# Patient Record
Sex: Female | Born: 1940 | Race: White | Hispanic: No | Smoking: Never smoker
Health system: Southern US, Community
[De-identification: ages and names within clinical notes are randomized; demographics above are authoritative.]

## PROBLEM LIST (undated history)

## (undated) DIAGNOSIS — I2510 Atherosclerotic heart disease of native coronary artery without angina pectoris: Secondary | ICD-10-CM

## (undated) DIAGNOSIS — I480 Paroxysmal atrial fibrillation: Secondary | ICD-10-CM

## (undated) DIAGNOSIS — G2581 Restless legs syndrome: Secondary | ICD-10-CM

## (undated) DIAGNOSIS — E119 Type 2 diabetes mellitus without complications: Secondary | ICD-10-CM

## (undated) HISTORY — PX: ABDOMINAL HYSTERECTOMY: SHX81

## (undated) HISTORY — PX: CHOLECYSTECTOMY: SHX55

## (undated) HISTORY — PX: APPENDECTOMY: SHX54

---

## 2000-05-21 ENCOUNTER — Other Ambulatory Visit: Admission: RE | Admit: 2000-05-21 | Discharge: 2000-05-21 | Payer: Self-pay | Admitting: Gynecology

## 2002-05-23 ENCOUNTER — Other Ambulatory Visit: Admission: RE | Admit: 2002-05-23 | Discharge: 2002-05-23 | Payer: Self-pay | Admitting: Gynecology

## 2003-08-08 ENCOUNTER — Other Ambulatory Visit: Admission: RE | Admit: 2003-08-08 | Discharge: 2003-08-08 | Payer: Self-pay | Admitting: Gynecology

## 2004-08-27 ENCOUNTER — Other Ambulatory Visit: Admission: RE | Admit: 2004-08-27 | Discharge: 2004-08-27 | Payer: Self-pay | Admitting: Gynecology

## 2005-09-17 ENCOUNTER — Other Ambulatory Visit: Admission: RE | Admit: 2005-09-17 | Discharge: 2005-09-17 | Payer: Self-pay | Admitting: Gynecology

## 2012-12-08 ENCOUNTER — Telehealth: Payer: Self-pay | Admitting: *Deleted

## 2012-12-13 NOTE — Telephone Encounter (Signed)
Opened by mistake.

## 2016-01-19 DIAGNOSIS — R079 Chest pain, unspecified: Secondary | ICD-10-CM | POA: Diagnosis not present

## 2016-01-19 DIAGNOSIS — I10 Essential (primary) hypertension: Secondary | ICD-10-CM | POA: Diagnosis not present

## 2016-01-19 DIAGNOSIS — E785 Hyperlipidemia, unspecified: Secondary | ICD-10-CM | POA: Diagnosis not present

## 2016-01-20 DIAGNOSIS — I10 Essential (primary) hypertension: Secondary | ICD-10-CM | POA: Diagnosis not present

## 2016-01-20 DIAGNOSIS — E785 Hyperlipidemia, unspecified: Secondary | ICD-10-CM | POA: Diagnosis not present

## 2016-01-20 DIAGNOSIS — R079 Chest pain, unspecified: Secondary | ICD-10-CM | POA: Diagnosis not present

## 2017-04-13 DIAGNOSIS — I4891 Unspecified atrial fibrillation: Secondary | ICD-10-CM

## 2017-04-13 DIAGNOSIS — R079 Chest pain, unspecified: Secondary | ICD-10-CM

## 2017-04-13 DIAGNOSIS — R002 Palpitations: Secondary | ICD-10-CM | POA: Diagnosis not present

## 2017-04-14 DIAGNOSIS — I4891 Unspecified atrial fibrillation: Secondary | ICD-10-CM | POA: Diagnosis not present

## 2017-04-14 DIAGNOSIS — R079 Chest pain, unspecified: Secondary | ICD-10-CM | POA: Diagnosis not present

## 2017-04-23 ENCOUNTER — Ambulatory Visit: Payer: Medicare Other | Admitting: Cardiology

## 2019-02-20 ENCOUNTER — Other Ambulatory Visit: Payer: Self-pay | Admitting: Family Medicine

## 2019-12-12 ENCOUNTER — Other Ambulatory Visit: Payer: Self-pay | Admitting: Family Medicine

## 2020-06-05 ENCOUNTER — Inpatient Hospital Stay (HOSPITAL_COMMUNITY)
Admission: EM | Admit: 2020-06-05 | Discharge: 2020-06-10 | DRG: 177 | Disposition: A | Discharge status: Home Health | Payer: Medicare Other | Attending: Internal Medicine | Admitting: Internal Medicine

## 2020-06-05 ENCOUNTER — Emergency Department (HOSPITAL_COMMUNITY): Payer: Medicare Other

## 2020-06-05 ENCOUNTER — Other Ambulatory Visit: Payer: Self-pay

## 2020-06-05 ENCOUNTER — Encounter (HOSPITAL_COMMUNITY): Payer: Self-pay | Admitting: *Deleted

## 2020-06-05 DIAGNOSIS — E1165 Type 2 diabetes mellitus with hyperglycemia: Secondary | ICD-10-CM | POA: Diagnosis present

## 2020-06-05 DIAGNOSIS — Z888 Allergy status to other drugs, medicaments and biological substances status: Secondary | ICD-10-CM

## 2020-06-05 DIAGNOSIS — G9341 Metabolic encephalopathy: Secondary | ICD-10-CM | POA: Diagnosis not present

## 2020-06-05 DIAGNOSIS — Z9049 Acquired absence of other specified parts of digestive tract: Secondary | ICD-10-CM

## 2020-06-05 DIAGNOSIS — Z91040 Latex allergy status: Secondary | ICD-10-CM | POA: Diagnosis not present

## 2020-06-05 DIAGNOSIS — Z7982 Long term (current) use of aspirin: Secondary | ICD-10-CM

## 2020-06-05 DIAGNOSIS — R0602 Shortness of breath: Secondary | ICD-10-CM

## 2020-06-05 DIAGNOSIS — U071 COVID-19: Secondary | ICD-10-CM | POA: Diagnosis present

## 2020-06-05 DIAGNOSIS — R0902 Hypoxemia: Secondary | ICD-10-CM | POA: Diagnosis present

## 2020-06-05 DIAGNOSIS — Z6836 Body mass index (BMI) 36.0-36.9, adult: Secondary | ICD-10-CM

## 2020-06-05 DIAGNOSIS — E119 Type 2 diabetes mellitus without complications: Secondary | ICD-10-CM | POA: Diagnosis not present

## 2020-06-05 DIAGNOSIS — Z885 Allergy status to narcotic agent status: Secondary | ICD-10-CM

## 2020-06-05 DIAGNOSIS — R0603 Acute respiratory distress: Secondary | ICD-10-CM | POA: Diagnosis present

## 2020-06-05 DIAGNOSIS — I2510 Atherosclerotic heart disease of native coronary artery without angina pectoris: Secondary | ICD-10-CM | POA: Diagnosis present

## 2020-06-05 DIAGNOSIS — Z955 Presence of coronary angioplasty implant and graft: Secondary | ICD-10-CM

## 2020-06-05 DIAGNOSIS — Z90710 Acquired absence of both cervix and uterus: Secondary | ICD-10-CM | POA: Diagnosis not present

## 2020-06-05 DIAGNOSIS — Z905 Acquired absence of kidney: Secondary | ICD-10-CM

## 2020-06-05 DIAGNOSIS — R531 Weakness: Secondary | ICD-10-CM | POA: Diagnosis not present

## 2020-06-05 DIAGNOSIS — J1282 Pneumonia due to coronavirus disease 2019: Secondary | ICD-10-CM | POA: Diagnosis present

## 2020-06-05 DIAGNOSIS — R627 Adult failure to thrive: Secondary | ICD-10-CM | POA: Diagnosis present

## 2020-06-05 DIAGNOSIS — R5383 Other fatigue: Secondary | ICD-10-CM

## 2020-06-05 DIAGNOSIS — I480 Paroxysmal atrial fibrillation: Secondary | ICD-10-CM | POA: Diagnosis present

## 2020-06-05 DIAGNOSIS — Z7901 Long term (current) use of anticoagulants: Secondary | ICD-10-CM

## 2020-06-05 DIAGNOSIS — Z79899 Other long term (current) drug therapy: Secondary | ICD-10-CM

## 2020-06-05 HISTORY — DX: Paroxysmal atrial fibrillation: I48.0

## 2020-06-05 HISTORY — DX: Atherosclerotic heart disease of native coronary artery without angina pectoris: I25.10

## 2020-06-05 HISTORY — DX: Restless legs syndrome: G25.81

## 2020-06-05 HISTORY — DX: Type 2 diabetes mellitus without complications: E11.9

## 2020-06-05 LAB — CBC
HCT: 48 % — ABNORMAL HIGH (ref 36.0–46.0)
Hemoglobin: 16.4 g/dL — ABNORMAL HIGH (ref 12.0–15.0)
MCH: 28.8 pg (ref 26.0–34.0)
MCHC: 34.2 g/dL (ref 30.0–36.0)
MCV: 84.2 fL (ref 80.0–100.0)
Platelets: 155 10*3/uL (ref 150–400)
RBC: 5.7 MIL/uL — ABNORMAL HIGH (ref 3.87–5.11)
RDW: 13.2 % (ref 11.5–15.5)
WBC: 3.7 10*3/uL — ABNORMAL LOW (ref 4.0–10.5)
nRBC: 0 % (ref 0.0–0.2)

## 2020-06-05 LAB — COMPREHENSIVE METABOLIC PANEL
ALT: 33 U/L (ref 0–44)
AST: 34 U/L (ref 15–41)
Albumin: 3.2 g/dL — ABNORMAL LOW (ref 3.5–5.0)
Alkaline Phosphatase: 70 U/L (ref 38–126)
Anion gap: 13 (ref 5–15)
BUN: 21 mg/dL (ref 8–23)
CO2: 19 mmol/L — ABNORMAL LOW (ref 22–32)
Calcium: 8.6 mg/dL — ABNORMAL LOW (ref 8.9–10.3)
Chloride: 108 mmol/L (ref 98–111)
Creatinine, Ser: 0.83 mg/dL (ref 0.44–1.00)
GFR, Estimated: >60 mL/min (ref >60)
Glucose, Bld: 102 mg/dL — ABNORMAL HIGH (ref 70–99)
Potassium: 3.6 mmol/L (ref 3.5–5.1)
Sodium: 140 mmol/L (ref 135–145)
Total Bilirubin: 1.2 mg/dL (ref 0.3–1.2)
Total Protein: 6.2 g/dL — ABNORMAL LOW (ref 6.5–8.1)

## 2020-06-05 LAB — CBG MONITORING, ED
Glucose-Capillary: 95 mg/dL (ref 70–99)
Glucose-Capillary: 126 mg/dL — ABNORMAL HIGH (ref 70–99)

## 2020-06-05 LAB — URINALYSIS, ROUTINE W REFLEX MICROSCOPIC
Bacteria, UA: NONE SEEN
Bilirubin Urine: NEGATIVE
Glucose, UA: >=500 mg/dL — AB
Hgb urine dipstick: NEGATIVE
Ketones, ur: 20 mg/dL — AB
Leukocytes,Ua: NEGATIVE
Nitrite: NEGATIVE
Protein, ur: >=300 mg/dL — AB
Specific Gravity, Urine: 1.031 — ABNORMAL HIGH (ref 1.005–1.030)
pH: 5 (ref 5.0–8.0)

## 2020-06-05 LAB — FIBRINOGEN: Fibrinogen: 603 mg/dL — ABNORMAL HIGH (ref 210–475)

## 2020-06-05 LAB — C-REACTIVE PROTEIN: CRP: 3 mg/dL — ABNORMAL HIGH (ref <1.0)

## 2020-06-05 LAB — LACTIC ACID, PLASMA
Lactic Acid, Venous: 1 mmol/L (ref 0.5–1.9)
Lactic Acid, Venous: 1.1 mmol/L (ref 0.5–1.9)

## 2020-06-05 LAB — FERRITIN: Ferritin: 429 ng/mL — ABNORMAL HIGH (ref 11–307)

## 2020-06-05 LAB — MAGNESIUM: Magnesium: 1.9 mg/dL (ref 1.7–2.4)

## 2020-06-05 LAB — PROCALCITONIN: Procalcitonin: <0.1 ng/mL

## 2020-06-05 LAB — D-DIMER, QUANTITATIVE: D-Dimer, Quant: 0.54 ug/mL-FEU — ABNORMAL HIGH (ref 0.00–0.50)

## 2020-06-05 LAB — TRIGLYCERIDES: Triglycerides: 119 mg/dL (ref <150)

## 2020-06-05 LAB — LACTATE DEHYDROGENASE: LDH: 185 U/L (ref 98–192)

## 2020-06-05 MED ORDER — APIXABAN 5 MG PO TABS
5.0000 mg | ORAL_TABLET | Freq: Two times a day (BID) | ORAL | Status: DC
Start: 2020-06-05 — End: 2020-06-10
  Administered 2020-06-06 – 2020-06-10 (×10): 5 mg via ORAL
  Filled 2020-06-05 (×10): qty 1

## 2020-06-05 MED ORDER — SODIUM CHLORIDE 0.9 % IV SOLN
200.0000 mg | Freq: Once | INTRAVENOUS | Status: DC
  Filled 2020-06-05: qty 40

## 2020-06-05 MED ORDER — ENOXAPARIN SODIUM 40 MG/0.4ML ~~LOC~~ SOLN: 40.0000 mg | SUBCUTANEOUS | Status: DC

## 2020-06-05 MED ORDER — ASPIRIN 81 MG PO CHEW
81.0000 mg | CHEWABLE_TABLET | Freq: Every day | ORAL | Status: DC
Start: 2020-06-06 — End: 2020-06-10
  Administered 2020-06-06 – 2020-06-10 (×5): 81 mg via ORAL
  Filled 2020-06-05 (×5): qty 1

## 2020-06-05 MED ORDER — DEXAMETHASONE 4 MG PO TABS
6.0000 mg | ORAL_TABLET | Freq: Every day | ORAL | Status: DC
  Administered 2020-06-05 – 2020-06-09 (×5): 6 mg via ORAL
  Filled 2020-06-05 (×5): qty 2

## 2020-06-05 MED ORDER — ALBUTEROL SULFATE HFA 108 (90 BASE) MCG/ACT IN AERS
1.0000 | INHALATION_SPRAY | RESPIRATORY_TRACT | Status: DC | PRN
  Administered 2020-06-07: 1 via RESPIRATORY_TRACT
  Administered 2020-06-07: 2 via RESPIRATORY_TRACT
  Filled 2020-06-05: qty 6.7

## 2020-06-05 MED ORDER — PRAMIPEXOLE DIHYDROCHLORIDE 0.25 MG PO TABS
0.5000 mg | ORAL_TABLET | Freq: Three times a day (TID) | ORAL | Status: DC
  Administered 2020-06-05 – 2020-06-10 (×14): 0.5 mg via ORAL
  Filled 2020-06-05 (×16): qty 2

## 2020-06-05 MED ORDER — LINAGLIPTIN 5 MG PO TABS
5.0000 mg | ORAL_TABLET | Freq: Every day | ORAL | Status: DC
  Administered 2020-06-06 – 2020-06-10 (×5): 5 mg via ORAL
  Filled 2020-06-05 (×6): qty 1

## 2020-06-05 MED ORDER — SODIUM CHLORIDE 0.9 % IV SOLN: 100.0000 mg | Freq: Every day | INTRAVENOUS | Status: DC

## 2020-06-05 MED ORDER — FLECAINIDE ACETATE 50 MG PO TABS
75.0000 mg | ORAL_TABLET | Freq: Two times a day (BID) | ORAL | Status: DC
  Administered 2020-06-06 – 2020-06-10 (×10): 75 mg via ORAL
  Filled 2020-06-05 (×12): qty 2

## 2020-06-05 MED ORDER — IPRATROPIUM-ALBUTEROL 20-100 MCG/ACT IN AERS
1.0000 | INHALATION_SPRAY | Freq: Four times a day (QID) | RESPIRATORY_TRACT | Status: DC
  Administered 2020-06-05 – 2020-06-09 (×9): 1 via RESPIRATORY_TRACT
  Filled 2020-06-05 (×2): qty 4

## 2020-06-05 MED ORDER — INSULIN ASPART 100 UNIT/ML ~~LOC~~ SOLN
0.0000 [IU] | Freq: Three times a day (TID) | SUBCUTANEOUS | Status: DC
  Administered 2020-06-06: 3 [IU] via SUBCUTANEOUS
  Administered 2020-06-06: 5 [IU] via SUBCUTANEOUS
  Administered 2020-06-06: 2 [IU] via SUBCUTANEOUS
  Administered 2020-06-07 (×3): 3 [IU] via SUBCUTANEOUS
  Administered 2020-06-08: 5 [IU] via SUBCUTANEOUS
  Administered 2020-06-08: 2 [IU] via SUBCUTANEOUS
  Administered 2020-06-09: 1 [IU] via SUBCUTANEOUS
  Administered 2020-06-09: 2 [IU] via SUBCUTANEOUS
  Administered 2020-06-09: 1 [IU] via SUBCUTANEOUS
  Administered 2020-06-10 (×2): 3 [IU] via SUBCUTANEOUS

## 2020-06-05 NOTE — ED Triage Notes (Addendum)
Pt tested positive for covid on 1/20 and then today here with possible confusion, weakness, and low sats 78% RA. EMS placed patient on 02/4L and 92% now. BP 160/80. CBG 162 with history of DM. Last had tylenol at around 1200

## 2020-06-05 NOTE — ED Notes (Signed)
Dr. Velia Meyer paged to [place], RN paged by Levada Dy

## 2020-06-05 NOTE — ED Notes (Signed)
Hospital bed ordered.

## 2020-06-05 NOTE — ED Notes (Signed)
Called to pt's room by pharmacy tech. Pt found lying on the floor. Pt stated that she was too weak to get back in the bed so she laid down on the floor. Pt assisted back to bed. A/O to baseline.

## 2020-06-05 NOTE — ED Notes (Signed)
Pt's daughter Mickel Baas called concerned about her mother. Pt's daughter is concerned about her mother getting Remdesivir. 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 Pt's daughter is scared that the medication will be too harsh for her mother and wants to speak to a doctor about her mother's history and for reassurance of drug administration. This RN informed the Network engineer to page admitting doctor. This RN also informed pt's daughter Mickel Baas that I would get back to her as soon as I knew more information.

## 2020-06-05 NOTE — ED Notes (Signed)
Mickel Baas, daughter, [phone] would like an update when available

## 2020-06-05 NOTE — ED Notes (Signed)
Daughter- Contact- Melanye Hiraldo [phone]

## 2020-06-05 NOTE — ED Notes (Signed)
This RN paged admitting doctor due to not receiving a call back yet after secretary paged MD.

## 2020-06-05 NOTE — ED Notes (Signed)
Pt is A&Ox4, denies pain, and now eating dinner.

## 2020-06-05 NOTE — ED Provider Notes (Signed)
[facility] EMERGENCY DEPARTMENT Provider Note   CSN: [id number] Arrival date & time: 06/05/20  1509     History Chief Complaint  Patient presents with  . Covid Positive  . Weakness    Cynthia Kelly is a 80 y.o. female.  The history is provided by the patient and medical records. No language interpreter was used.  Shortness of Breath Severity:  Severe Onset quality:  Gradual Duration:  1 week Timing:  Constant Progression:  Worsening Chronicity:  New Context: URI (covid)   Relieved by:  Oxygen Worsened by:  Exertion Ineffective treatments:  None tried Associated symptoms: cough, fever and sputum production   Associated symptoms: no abdominal pain, no chest pain (resolved now), no diaphoresis, no headaches, no neck pain, no rash, no vomiting and no wheezing   Risk factors: no hx of PE/DVT        Past Medical History:  Diagnosis Date  . Diabetes mellitus without complication ([place])     There are no problems to display for this patient.   Past Surgical History:  Procedure Laterality Date  . ABDOMINAL HYSTERECTOMY    . APPENDECTOMY    . CHOLECYSTECTOMY       OB History   No obstetric history on file.     No family history on file.  Social History   Tobacco Use  . Smoking status: Never Smoker  . Smokeless tobacco: Never Used  Substance Use Topics  . Alcohol use: Not Currently  . Drug use: Not Currently    Home Medications Prior to Admission medications   Not on File    Allergies    Betadine [povidone iodine], Morphine and related, and Tape  Review of Systems   Review of Systems  Constitutional: Positive for chills, fatigue and fever. Negative for diaphoresis.  HENT: Positive for congestion.   Eyes: Negative for visual disturbance.  Respiratory: Positive for cough, sputum production, chest tightness and shortness of breath. Negative for wheezing and stridor.   Cardiovascular: Negative for chest pain (resolved now),  palpitations and leg swelling.  Gastrointestinal: Positive for diarrhea and nausea. Negative for abdominal pain, constipation and vomiting.  Genitourinary: Negative for dysuria, flank pain and frequency.  Musculoskeletal: Negative for back pain, neck pain and neck stiffness.  Skin: Negative for rash and wound.  Neurological: Negative for dizziness, light-headedness, numbness and headaches.  Psychiatric/Behavioral: Negative for agitation.  All other systems reviewed and are negative.   Physical Exam Updated Vital Signs BP (!) 133/55 (BP Location: Left Arm)   Pulse 70   Temp (!) 101.6 F (38.7 C) (Oral)   Resp 20   SpO2 91%   Physical Exam Vitals and nursing note reviewed.  Constitutional:      General: She is not in acute distress.    Appearance: She is well-developed and well-nourished. She is ill-appearing. She is not toxic-appearing or diaphoretic.  HENT:     Head: Normocephalic and atraumatic.     Right Ear: External ear normal.     Left Ear: External ear normal.     Nose: Congestion present. No rhinorrhea.     Mouth/Throat:     Mouth: Oropharynx is clear and moist.  Eyes:     Extraocular Movements: EOM normal.     Conjunctiva/sclera: Conjunctivae normal.     Pupils: Pupils are equal, round, and reactive to light.  Cardiovascular:     Rate and Rhythm: Normal rate.     Pulses: Normal pulses.     Heart sounds:  No murmur heard.   Pulmonary:     Effort: Pulmonary effort is normal. No respiratory distress.     Breath sounds: No stridor. Rhonchi present. No wheezing or rales.  Chest:     Chest wall: No tenderness.  Abdominal:     General: Abdomen is flat. There is no distension.     Tenderness: There is no abdominal tenderness. There is no right CVA tenderness, left CVA tenderness, guarding or rebound.  Musculoskeletal:        General: No tenderness.     Cervical back: Normal range of motion and neck supple. No tenderness.     Right lower leg: No edema.     Left  lower leg: No edema.  Skin:    General: Skin is warm.     Capillary Refill: Capillary refill takes less than 2 seconds.     Coloration: Skin is not pale.     Findings: No erythema or rash.  Neurological:     Mental Status: She is alert and oriented to person, place, and time.     Sensory: No sensory deficit.     Motor: No weakness or abnormal muscle tone.     Deep Tendon Reflexes: Reflexes are normal and symmetric.  Psychiatric:        Mood and Affect: Mood normal.     ED Results / Procedures / Treatments   Labs (all labs ordered are listed, but only abnormal results are displayed) Labs Reviewed  CBC - Abnormal; Notable for the following components:      Result Value   WBC 3.7 (*)    RBC 5.70 (*)    Hemoglobin 16.4 (*)    HCT 48.0 (*)    All other components within normal limits  URINALYSIS, ROUTINE W REFLEX MICROSCOPIC - Abnormal; Notable for the following components:   Color, Urine AMBER (*)    APPearance HAZY (*)    Specific Gravity, Urine 1.031 (*)    Glucose, UA >=500 (*)    Ketones, ur 20 (*)    Protein, ur >=300 (*)    All other components within normal limits  COMPREHENSIVE METABOLIC PANEL - Abnormal; Notable for the following components:   CO2 19 (*)    Glucose, Bld 102 (*)    Calcium 8.6 (*)    Total Protein 6.2 (*)    Albumin 3.2 (*)    All other components within normal limits  D-DIMER, QUANTITATIVE (NOT AT [facility]) - Abnormal; Notable for the following components:   D-Dimer, Quant 0.54 (*)    All other components within normal limits  FERRITIN - Abnormal; Notable for the following components:   Ferritin 429 (*)    All other components within normal limits  FIBRINOGEN - Abnormal; Notable for the following components:   Fibrinogen 603 (*)    All other components within normal limits  C-REACTIVE PROTEIN - Abnormal; Notable for the following components:   CRP 3.0 (*)    All other components within normal limits  CBG MONITORING, ED - Abnormal; Notable for  the following components:   Glucose-Capillary 126 (*)    All other components within normal limits  CULTURE, BLOOD (ROUTINE X 2)  CULTURE, BLOOD (ROUTINE X 2)  LACTIC ACID, PLASMA  LACTIC ACID, PLASMA  PROCALCITONIN  LACTATE DEHYDROGENASE  TRIGLYCERIDES  MAGNESIUM  HEMOGLOBIN A1C  C-REACTIVE PROTEIN  FERRITIN  FIBRINOGEN  LACTATE DEHYDROGENASE  MAGNESIUM  PHOSPHORUS  COMPREHENSIVE METABOLIC PANEL  CBC WITH DIFFERENTIAL/PLATELET  TSH  CBG MONITORING, ED  EKG EKG Interpretation  Date/Time:  Tuesday June 05 2020 15:22:28 EST Ventricular Rate:  67 PR Interval:  152 QRS Duration: 72 QT Interval:  366 QTC Calculation: 386 R Axis:   77 Text Interpretation: Normal sinus rhythm Low voltage QRS Cannot rule out Anterior infarct , age undetermined Abnormal ECG No prior ECG for comparison. NO STEMI Confirmed by Theda Belfast ([id number]) on 06/05/2020 3:37:43 PM   Radiology DG Chest Portable 1 View  Result Date: 06/05/2020 CLINICAL DATA:  COVID-19 positivity EXAM: PORTABLE CHEST 1 VIEW COMPARISON:  11/20/2019 FINDINGS: Cardiac shadow is within normal limits. Patchy airspace opacity is noted in the left base consistent with the given clinical history. No bony abnormality is noted. IMPRESSION: Left basilar airspace opacity consistent with the given clinical history Electronically Signed   By: Alcide Clever M.D.   On: 06/05/2020 16:30    Procedures Procedures   CRITICAL CARE Performed by: Canary Brim Eluterio Seymour Total critical care time: 35 minutes Critical care time was exclusive of separately billable procedures and treating other patients. Critical care was necessary to treat or prevent imminent or life-threatening deterioration. Critical care was time spent personally by me on the following activities: development of treatment plan with patient and/or surrogate as well as nursing, discussions with consultants, evaluation of patient's response to treatment, examination of patient,  obtaining history from patient or surrogate, ordering and performing treatments and interventions, ordering and review of laboratory studies, ordering and review of radiographic studies, pulse oximetry and re-evaluation of patient's condition.   Medications Ordered in ED Medications  dexamethasone (DECADRON) tablet 6 mg (6 mg Oral Given 06/05/20 1906)  pramipexole (MIRAPEX) tablet 0.5 mg (0.5 mg Oral Given 06/05/20 1929)  albuterol (VENTOLIN HFA) 108 (90 Base) MCG/ACT inhaler 1-2 puff (has no administration in time range)  insulin aspart (novoLOG) injection 0-6 Units (has no administration in time range)  linagliptin (TRADJENTA) tablet 5 mg (5 mg Oral Not Given 06/05/20 2203)  Ipratropium-Albuterol (COMBIVENT) respimat 1 puff (1 puff Inhalation Given 06/05/20 2202)  aspirin chewable tablet 81 mg (has no administration in time range)    ED Course  I have reviewed the triage vital signs and the nursing notes.  Pertinent labs & imaging results that were available during my care of the patient were reviewed by me and considered in my medical decision making (see chart for details).    MDM Rules/Calculators/A&P                          KEORA LEINONEN is a 80 y.o. female with a past medical history significant for diabetes, paroxysmal atrial fibrillation on eliquis, renal carcinoma status post left nephrectomy in 1986, CAD with PCI, prior appendectomy, prior cholecystectomy, and prior hysterectomy and recent diagnosis of COVID-19 found to be positive on 05/31/2020 as per care everywhere who presents with worsening shortness of breath and fatigue.  Patient was found to have oxygen saturations in the 70s with EMS and she does not take oxygen at home.  She was Found of oxygen saturation of 78% on room air while at rest.  Patient is now on 3 L to maintain oxygen saturations in the low 90s.  Patient reports she has had worsening shortness of breath over the last week and has continued to have some nausea  and diarrhea.  She reports no chest pain at this time but reports she had some chest pain earlier in the week.  She reports usually short of breath and  very fatigued.  She reports no urinary changes.  She does not report any significant headache or neck pain.  She reports general malaise.  On exam, lungs are coarse bilaterally.  Chest and abdomen are nontender.  Bowel sounds were appreciated.  She does have intact pulses in extremities and has no significant lower extremity tenderness or edema.  She denies any leg pain or leg swelling.  Patient is found to be febrile and was hypoxic with EMS.  Patient remains on oxygen supplementation.  Patient will have preadmission labs for Covid.  As her oxygen was in the 70s at home, anticipate admission for further management.  She reports she is not vaccinated.  She reports that the rest of the family all had Covid around the new year but she did not catch it until last week.  Anticipate admission after work-up is completed.  5:14 PM Initial labs have returned.  No evidence of acute kidney injury or significant liver abnormality.  No leukocytosis.  Lactic acid not elevated.  X-ray shows a lung opacity consistent with clinical history of Covid.  Patient will get the other Covid preadmission labs collected but we will call for admission for Covid causing hypoxia.      Final Clinical Impression(s) / ED Diagnoses Final diagnoses:  COVID-19  Fatigue, unspecified type  Hypoxia    Clinical Impression: 1. COVID-19   2. Fatigue, unspecified type   3. Hypoxia     Disposition: Admit  This note was prepared with assistance of Dragon voice recognition software. Occasional wrong-word or sound-a-like substitutions may have occurred due to the inherent limitations of voice recognition software.     Casady Voshell, Gwenyth Allegra, MD 06/05/20 [phone]

## 2020-06-05 NOTE — ED Notes (Signed)
Tele

## 2020-06-05 NOTE — H&P (Signed)
History and Physical    PLEASE NOTE THAT DRAGON DICTATION SOFTWARE WAS USED IN THE CONSTRUCTION OF THIS NOTE.   Cynthia Kelly:[id number] DOB: 06-24-1940 DOA: 06/05/2020  PCP: Cynthia Morning, DO Patient coming from: home   I have personally briefly reviewed patient's old medical records in [place]  Chief Complaint: shortness of breath  HPI: Cynthia Kelly is a 80 y.o. female with medical history significant for coronary artery disease status post PCI with stent placement to the LAD in July 2021, paroxysmal atrial fibrillation chronically anticoagulated on Eliquis, type 2 diabetes mellitus, status post nephrectomy, who is admitted to [facility] on 06/05/2020 with severe COVID-19 pneumonia after presenting from home to [facility] Emergency Department complaining of shortness of breath.   The following history is provided via my discussions with the patient as well as her daughter in addition to my discussions with the emergency department physician and via chart review.  The patient reports 5 to 6 days of progressive shortness of breath associated with new onset productive cough over that timeframe.  She also reports associated subjective fever and chills in the absence of full body rigors.  Denies any associated chest pain, palpitations, diaphoresis, nausea, vomiting, diarrhea, abdominal pain, or rash.  She reports that her shortness of breath is not been associated with any orthopnea, PND, or worsening peripheral edema.  Denies any recent calf tenderness, or lower extremity erythema.  She notes progressive generalized weakness over the last 4 to 5 days in the absence of any acute focal neurologic deficits.  Denies any chronic underlying pulmonary pathology, and denies any baseline supplemental oxygen requirements.  This is in the context of several family members who developed respiratory symptoms during the first week of January 2022 and were subsequently found to test positive  for Covid.  Patient reports that she has near daily contact with these individuals.  Denies any recent travel.  She acknowledges that she is unvaccinated.  Denies any associated headache, neck stiffness, wheezing.  Denies any recent dysuria, gross hematuria, or change in urinary urgency/frequency.  At the onset of the above respiratory symptoms, the patient underwent outpatient COVID-19 testing, 05/31/2020, which was found to be positive and verified in review of Care Everywhere.  In the setting of further progression of the above symptoms, the patient contacted EMS earlier today and was brought to [facility] ED for further evaluation of the above.  EMS reportedly noted the patient's initial oxygen saturation to be in the high 70s on room air, prompting placement of 3 L nasal cannula upon which the patient arrived at [facility] emergency department.    ED Course:  Vital signs in the ED were notable for the following: Temperature max one 1.6, heart rate 63-70; blood pressure 133/55; respiratory rate 20, initial oxygen saturation in the ED found to be 91% on room air, which improved to 95 to 97% on 2 L nasal cannula.  Labs were notable for the following: CMP was notable for the following: Sodium 140, creatinine 0.83, glucose 102, ALT 33.  CBC notable for white blood cell count of 3700.  Lactic acid 1.0.  Urinalysis was notable for 6-10 white blood cells, no bacteria, nitrate negative, leukocyte Estrace negative.  Chest x-ray shows left patchy airspace opacity consistent with Covid pneumonia in the absence of evidence of pleural effusion or pneumothorax.  EKG shows normal sinus rhythm with heart rate 67, normal intervals, no evidence of T wave or ST changes, including no evidence of  ST elevation, and showed potential Q waves in V1/V2 with no prior EKG available for point of comparison.     Review of Systems: As per HPI otherwise 10 point review of systems negative.   Past Medical History:   Diagnosis Date  . Diabetes mellitus without complication [facility])     Past Surgical History:  Procedure Laterality Date  . ABDOMINAL HYSTERECTOMY    . APPENDECTOMY    . CHOLECYSTECTOMY      Social History:  reports that she has never smoked. She has never used smokeless tobacco. She reports previous alcohol use. She reports previous drug use.   Allergies  Allergen Reactions  . Betadine [Povidone Iodine]   . Morphine And Related   . Tape     adhesive    Family history reviewed and not pertinent    Home Medications: preliminary outpatient medications, while awaiting inpatient pharmacy consult confirmation, including the following: Pramipaxole 0.5 mg PO TID; glipizide; ASA 81 mg po Qdaily, Eliquis.    Objective    Physical Exam: Vitals:   06/05/20 1509 06/05/20 1618 06/05/20 1645  BP: (!) 133/55 135/64 136/63  Pulse: 70 64 63  Resp: '20 20 20  ' Temp: (!) 101.6 F (38.7 C) (!) 100.9 F (38.3 C)   TempSrc: Oral    SpO2: 91% 96% 95%    General: appears to be stated age; alert, oriented Skin: warm, dry, no rash Head:  AT/[place] Mouth:  Oral mucosa membranes appear moist, normal dentition Neck: supple; trachea midline Heart:  RRR; did not appreciate any M/R/G Lungs: left basilar rhonchi noted, otherwise CTAB; did not appreciate any wheezes, rales Abdomen: + BS; soft, ND, NT Vascular: 2+ pedal pulses b/l; 2+ radial pulses b/l Extremities: no peripheral edema, no muscle wasting Neuro: strength and sensation intact in upper and lower extremities b/l   Labs on Admission: I have personally reviewed following labs and imaging studies  CBC: Recent Labs  Lab 06/05/20 1518  WBC 3.7*  HGB 16.4*  HCT 48.0*  MCV 84.2  PLT 462   Basic Metabolic Panel: Recent Labs  Lab 06/05/20 1518  NA 140  K 3.6  CL 108  CO2 19*  GLUCOSE 102*  BUN 21  CREATININE 0.83  CALCIUM 8.6*   GFR: CrCl cannot be calculated (Unknown ideal weight.). Liver Function Tests: Recent Labs   Lab 06/05/20 1518  AST 34  ALT 33  ALKPHOS 70  BILITOT 1.2  PROT 6.2*  ALBUMIN 3.2*   No results for input(s): LIPASE, AMYLASE in the last 168 hours. No results for input(s): AMMONIA in the last 168 hours. Coagulation Profile: No results for input(s): INR, PROTIME in the last 168 hours. Cardiac Enzymes: No results for input(s): CKTOTAL, CKMB, CKMBINDEX, TROPONINI in the last 168 hours. BNP (last 3 results) No results for input(s): PROBNP in the last 8760 hours. HbA1C: No results for input(s): HGBA1C in the last 72 hours. CBG: No results for input(s): GLUCAP in the last 168 hours. Lipid Profile: No results for input(s): CHOL, HDL, LDLCALC, TRIG, CHOLHDL, LDLDIRECT in the last 72 hours. Thyroid Function Tests: No results for input(s): TSH, T4TOTAL, FREET4, T3FREE, THYROIDAB in the last 72 hours. Anemia Panel: No results for input(s): VITAMINB12, FOLATE, FERRITIN, TIBC, IRON, RETICCTPCT in the last 72 hours. Urine analysis: No results found for: COLORURINE, APPEARANCEUR, LABSPEC, [place], GLUCOSEU, HGBUR, BILIRUBINUR, KETONESUR, PROTEINUR, UROBILINOGEN, NITRITE, LEUKOCYTESUR  Radiological Exams on Admission: DG Chest Portable 1 View  Result Date: 06/05/2020 CLINICAL DATA:  COVID-19 positivity EXAM: PORTABLE  CHEST 1 VIEW COMPARISON:  11/20/2019 FINDINGS: Cardiac shadow is within normal limits. Patchy airspace opacity is noted in the left base consistent with the given clinical history. No bony abnormality is noted. IMPRESSION: Left basilar airspace opacity consistent with the given clinical history Electronically Signed   By: Inez Catalina M.D.   On: 06/05/2020 16:30     EKG: Independently reviewed, with result as described above.    Assessment/Plan   Cynthia Kelly is a 80 y.o. female with medical history significant for coronary artery disease status post PCI with stent placement to the LAD in July 2021, paroxysmal atrial fibrillation chronically anticoagulated on Eliquis,  type 2 diabetes mellitus, status post nephrectomy, who is admitted to [facility] on 06/05/2020 with severe COVID-19 pneumonia after presenting from home to [facility] Emergency Department complaining of shortness of breath.    Principal Problem:   COVID-19 virus infection Active Problems:   SOB (shortness of breath)   Generalized weakness   Diabetes mellitus without complication (HCC)   CAD (coronary artery disease)   Paroxysmal atrial fibrillation (HCC)    #) Severe COVID-19 pneumonia: diagnosis on the basis of: 5 to 6 days of progressive shortness of breath associated with new onset productive cough, fever, chills, the setting of multiple exposures to multiple family members with recent confirmed COVID-19 infections, with outpatient COVID-19 testing performed on the patient on 05/31/2020 found to be positive.  Additionally, in the setting of no baseline supplemental oxygen requirements, patient's initial oxygen saturations were reported to be in the high 70s, with subsequent improvement into the mid to high 20s on 2 to 2.5 L nasal cannula, thereby meeting criteria for acute hypoxic respiratory distress, while presenting chest x-ray showed evidence of left patchy airspace opacity consistent with Covid pneumonia.  in setting of acute hypoxia, criteria are met from patient's COVID-19 infection to be considered severe in nature. Consequently, there is a Grade 2c rec for dexamethasone, which is further supported by treatment guidance recommendations from [place]'s Covid Treatment Guidelines.   Of note, in the setting of the patient's age greater than 64 as well as comorbidities that include type 2 diabetes mellitus and coronary artery disease, this patient meets criteria to be considered high risk for a more complicated clinical course of COVID-19 infection, including increased probability for progression of the severity associated with their clinical course. Therefore, in the setting of symptomatic  COVID-19 infection requiring hospitalization for further evaluation and management thereof, and given that duration since onset of patient's respiratory symptoms is less than 7 days in the context of the presence of high risk criteria, indications are met for initiation of remdesivir per treatment guidance recommendations from [place]'s Covid Treatment Guidelines. Of note, ALT found to be less than 220. Therefore, there is no contraindication for initiation of remdesivir on the basis of transaminitis.  I discussed with the patient as well as her daughter the aforementioned indications, risks, and potential benefits for early initiation of remdesivir given the patient's high risk qualifiers.  However, following these discussions, the patient and her daughter requested that the patient not receive remdesivir due to their concerns regarding the possibility of worsening kidney function, particularly given the patient's history of nephrectomy.  Consequently, we will refrain from initiation of remdesivir per the patient and her family's request, as above.  Explained to the patient and the family that we can consider Tocilizumab if rapid progression of supplemental oxygen demand or if development of need for high flow O2; worsening  hypoxemia and CRP > 10.  Of note, generalized inflammatory marker results are currently pending.  The patient and her daughter verbalized their understanding of the above.   Of note, the patient denies any chronic underlying pulmonary pathology.,  And acknowledges that she is unvaccinated. Of note, in the setting of a history of diabetes, will initiate daily linagliptin as DPP-4 inhibitors have been shown to reduce mortality in patients with DM2 and a COVID-19.      Plan: Airborne and contact precautions. Monitor continuous pulse oximetry and monitor on telemetry. prn supplemental O2 to maintain O2 sats greater than or equal to 94%. Proning protocol initiated. PRN albuterol inhaler.  Scheduled combivent inhaler q6H. PRN acetaminophen for fever. Start dexamethasone.  Will refrain from initiation of remdesivir per instruction of both the patient and her family following the above discussion regarding the risks versus benefits versus indications for initiation of this medication. Add-on inflammatory markers (fibrinogen, d dimer or fibrin derivatives, crp, ferritin, LDH) and repeat the studies in the Kelly. Check serum magnesium and phosphorus levels. Check CMP and CBC in the Kelly.  Consider checking ABG for the purpose of evaluating PaO2 to FiO2 ratio. Flutter valve and incentive spirometry. If rapid progression of supplemental oxygen demand or if development of need for high flow O2, would consider initiation of Tocilizumab at that point. start linagliptin as above. icated if ALT greater than 220)      #) Acute hypoxic respiratory distress: in the context of no known baseline supplemental oxygen requirements, presenting O2 sat reported to be in the high 70s, which is improved to the mid to high 90s on 2 to 2.5 L nasal cannula, thereby meeting criteria for acute hypoxic respiratory distress as opposed to acute hypoxic respiratory failure at this time. Appears to be on the basis of COVID-19 infection, as above. No known chronic underlying pulmonary conditions. ACS is felt to be less likely at this time in the absence of any recent chest pain and in the context of presenting EKG showing no evidence of acute ischemic process. No clinical or radiographic evidence of suggest acutely decompensated heart failure at this time. While there is increased risk for acute PE in the setting of COVID-19 infection, clinically, this appears to be less likely at this time. If rapid progression of supplemental oxygen demand or if development of need for high flow O2, would consider initiation of Tocilizumab at that point.   Plan: further evaluation and management of presenting COVID-19 infection, as  above, including monitoring of continuous pulse oximetry with prn supplemental O2 to maintain O2 sats greater than or equal to 94%. monitor on telemetry. Trending of inflammatory markers, as above. Check CMP and CBC in the Kelly. Check serum Mg and Phos levels. Flutter valve and incentive spirometry. PRN albuterol inhaler. Scheduled combivent inhaler q6H. Decadron, as above.       #) Generalized weakness: the patient reports 3 to 4 days of progressive generalized weakness, in the absence of any evidence of acute focal neurologic deficits, including no evidence of acute focal weakness. Consequently, acute ischemic CVA is felt to be less likely at this time. Does not appear to be a/w any acute myalgias. Suspect contribution from physiologic stress stemming from presenting severe COVID-19 pneumonia, as further described above.  Presenting urinalysis was not consistent with UTI.   Plan: work-up and management of presenting severe COVID-19 pneumonia, as described above. Physical therapy consult has been placed for tomorrow Kelly.  Also check TSH.      #)  Coronary artery disease status post stent to the LAD in July 2021.  On daily baby aspirin.  Plan: Continue home daily baby aspirin.     #) Type 2 diabetes mellitus: The patient reports that she is on glipizide as an outpatient, but denies any use of exogenous insulin at home.  Presenting blood sugar noted to be 102 per presenting CMP.  Plan: Hold home glipizide during his hospitalization.  Have ordered Accu-Cheks before every meal and at bedtime with associated very low-dose sliding scale insulin.  Will check hemoglobin A1c.      #) Paroxysmal atrial fibrillation: Documented history of such. In the setting of a CHA2DS2-VASc score of 5, there is an indication for the patient to be on chronic anticoagulation for thromboembolic prophylaxis. Consistent with this, the patient reports that she is chronically anticoagulated on Eliquis.  Presenting EKG suggestive of normal sinus rhythm without evidence of acute ischemic changes, as further qualified above.    Plan: monitor strict I's & O's and daily weights. Repeat BMP in the Kelly. Check serum magnesium level. Continue home Eliquis.      DVT prophylaxis: will resume home Eliquis pending inpatient pharmacy consultation for outpatient med rec Code Status: Full code Family Communication: I discussed the patient's case with her daughter (POA), Yonna Alwin, who can be reached at the following phone #: [phone] Disposition Plan: Per Rounding Team Consults called: none  Admission status: inpatient; med-tele.     Of note, this patient was added by me to the following Admit List/Treatment Team:  [email or web address]      PLEASE NOTE THAT DRAGON DICTATION SOFTWARE WAS USED IN THE CONSTRUCTION OF THIS NOTE.   [facility] Pager [phone] From 12PM- 8PM  Otherwise, please contact night-coverage  [email or web address] Password [facility]  06/05/2020, 5:43 PM

## 2020-06-06 DIAGNOSIS — U071 COVID-19: Principal | ICD-10-CM

## 2020-06-06 LAB — COMPREHENSIVE METABOLIC PANEL
ALT: 43 U/L (ref 0–44)
AST: 45 U/L — ABNORMAL HIGH (ref 15–41)
Albumin: 3 g/dL — ABNORMAL LOW (ref 3.5–5.0)
Alkaline Phosphatase: 71 U/L (ref 38–126)
Anion gap: 13 (ref 5–15)
BUN: 23 mg/dL (ref 8–23)
CO2: 19 mmol/L — ABNORMAL LOW (ref 22–32)
Calcium: 8.5 mg/dL — ABNORMAL LOW (ref 8.9–10.3)
Chloride: 106 mmol/L (ref 98–111)
Creatinine, Ser: 0.76 mg/dL (ref 0.44–1.00)
GFR, Estimated: >60 mL/min (ref >60)
Glucose, Bld: 234 mg/dL — ABNORMAL HIGH (ref 70–99)
Potassium: 3.7 mmol/L (ref 3.5–5.1)
Sodium: 138 mmol/L (ref 135–145)
Total Bilirubin: 1.4 mg/dL — ABNORMAL HIGH (ref 0.3–1.2)
Total Protein: 6.1 g/dL — ABNORMAL LOW (ref 6.5–8.1)

## 2020-06-06 LAB — FERRITIN: Ferritin: 529 ng/mL — ABNORMAL HIGH (ref 11–307)

## 2020-06-06 LAB — CBC WITH DIFFERENTIAL/PLATELET
Abs Immature Granulocytes: 0.03 10*3/uL (ref 0.00–0.07)
Basophils Absolute: 0 10*3/uL (ref 0.0–0.1)
Basophils Relative: 0 %
Eosinophils Absolute: 0 10*3/uL (ref 0.0–0.5)
Eosinophils Relative: 0 %
HCT: 49 % — ABNORMAL HIGH (ref 36.0–46.0)
Hemoglobin: 15.6 g/dL — ABNORMAL HIGH (ref 12.0–15.0)
Immature Granulocytes: 1 %
Lymphocytes Relative: 36 %
Lymphs Abs: 1 10*3/uL (ref 0.7–4.0)
MCH: 27.7 pg (ref 26.0–34.0)
MCHC: 31.8 g/dL (ref 30.0–36.0)
MCV: 87 fL (ref 80.0–100.0)
Monocytes Absolute: 0.2 10*3/uL (ref 0.1–1.0)
Monocytes Relative: 8 %
Neutro Abs: 1.5 10*3/uL — ABNORMAL LOW (ref 1.7–7.7)
Neutrophils Relative %: 55 %
Platelets: 128 10*3/uL — ABNORMAL LOW (ref 150–400)
RBC: 5.63 MIL/uL — ABNORMAL HIGH (ref 3.87–5.11)
RDW: 13 % (ref 11.5–15.5)
WBC: 2.7 10*3/uL — ABNORMAL LOW (ref 4.0–10.5)
nRBC: 0 % (ref 0.0–0.2)

## 2020-06-06 LAB — CBG MONITORING, ED
Glucose-Capillary: 256 mg/dL — ABNORMAL HIGH (ref 70–99)
Glucose-Capillary: 224 mg/dL — ABNORMAL HIGH (ref 70–99)
Glucose-Capillary: 392 mg/dL — ABNORMAL HIGH (ref 70–99)

## 2020-06-06 LAB — HEMOGLOBIN A1C
Hgb A1c MFr Bld: 8.6 % — ABNORMAL HIGH (ref 4.8–5.6)
Mean Plasma Glucose: 200.12 mg/dL

## 2020-06-06 LAB — TSH: TSH: 0.52 u[IU]/mL (ref 0.350–4.500)

## 2020-06-06 LAB — LACTATE DEHYDROGENASE: LDH: 206 U/L — ABNORMAL HIGH (ref 98–192)

## 2020-06-06 LAB — FIBRINOGEN: Fibrinogen: 592 mg/dL — ABNORMAL HIGH (ref 210–475)

## 2020-06-06 LAB — PHOSPHORUS: Phosphorus: 3.7 mg/dL (ref 2.5–4.6)

## 2020-06-06 LAB — C-REACTIVE PROTEIN: CRP: 2.9 mg/dL — ABNORMAL HIGH (ref <1.0)

## 2020-06-06 LAB — MAGNESIUM: Magnesium: 1.8 mg/dL (ref 1.7–2.4)

## 2020-06-06 NOTE — ED Notes (Signed)
Called 2W for report awaiting nurse for report.

## 2020-06-06 NOTE — ED Notes (Signed)
Patient assisted with bed pan use, patient voided. Tolerated well. Patient pulled up in bed and assisted with further repositioning. She denies further needs at this time.

## 2020-06-06 NOTE — ED Notes (Signed)
Lunch Tray Ordered @ 1032. 

## 2020-06-06 NOTE — ED Notes (Signed)
Patient cleansed of BM. Loose stool noted. Brief changed. Patient assisted with repositioning. Patient denies further needs.

## 2020-06-06 NOTE — Progress Notes (Signed)
PROGRESS NOTE    Cynthia Kelly  QZE:[id number] DOB: 05/20/1940 DOA: 06/05/2020 PCP: Janie Morning, DO   Brief Narrative:  Cynthia Kelly is a 80 y.o. female with medical history significant for coronary artery disease status post PCI with stent placement to the LAD in July 2021, paroxysmal atrial fibrillation chronically anticoagulated on Eliquis, type 2 diabetes mellitus, status post nephrectomy, who is admitted to [facility] on 06/05/2020 with severe COVID-19 pneumonia after presenting from home to [facility] Emergency Department complaining of shortness of breath. The following history is provided via my discussions with the patient as well as her daughter in addition to my discussions with the emergency department physician and via chart review. The patient reports 5 to 6 days of progressive shortness of breath associated with new onset productive cough over that timeframe.  She also reports associated subjective fever and chills in the absence of full body rigors.  Denies any associated chest pain, palpitations, diaphoresis, nausea, vomiting, diarrhea, abdominal pain, or rash.  She reports that her shortness of breath is not been associated with any orthopnea, PND, or worsening peripheral edema. COVID-19 testing positive as of 05/31/2020 - [facility] to [facility] for further evaluation of worsening of symptoms. EMS reportedly noted the patient's initial oxygen saturation to be in the high 70s on room air, prompting placement of 3 L nasal cannula upon which the patient arrived at [facility] emergency department.  Assessment & Plan:   Principal Problem:   COVID-19 virus infection Active Problems:   SOB (shortness of breath)   Generalized weakness   Diabetes mellitus without complication (HCC)   CAD (coronary artery disease)   Paroxysmal atrial fibrillation (HCC)   Acute hypoxia secondary to COVID-19 pneumonia, resolving - Continue steroids - Discontinue remdesivir given transient  hypoxia that has resolved since admission - No indication for baricitinib - Covid inflammatory markers minimally elevated - likely indicating good prognosis and early resolution - CXR - personally reviewed minimal bibasilar opacities - likely resolving inflammation Recent Labs    06/05/20 1805 06/06/20 0633  DDIMER 0.54*  --   FERRITIN 429* 529*  LDH 185 206*  CRP 3.0* 2.9*    Generalized weakness/ambulatory dysfunction: PT evaluated and recommended ongoing PT but patient refused to be re-evaluated PT currently recommending home health/PT  CAD, stable  Type 2 diabetes mellitus Lab Results  Component Value Date   HGBA1C 8.6 (H) 06/06/2020  Uncontrolled Continue to monitor closely while on steroids Glucose quite labile today - continue SSI/Hypoglycemic protocoll  Paroxysmal atrial fibrillation:  CHA2DS2-VASc score of 5 on Eliquis   DVT prophylaxis: Eliquis Code Status: Full code Family Communication: I discussed the patient's case with her daughter (POA), Darra Rosa, at length - we discussed current goals of care, disposition, and prognosis.   Status is: inpt  Dispo: The patient is from: home              Anticipated d/c is to: home with home health              Anticipated d/c date is: 24-48h              Patient currently not medically stable for discharge  Consultants:   None  Procedures:   None  Antimicrobials:  None   Subjective: No acute issues/events overnight.  Objective: Vitals:   06/06/20 0530 06/06/20 0545 06/06/20 0600 06/06/20 0615  BP: 124/65 117/62 125/64 115/63  Pulse: (!) 50 (!) 52 (!) 50 (!) 51  Resp: Marland Kitchen)  23 (!) 25 (!) 22 19  Temp:      TempSrc:      SpO2: 96% 96% 96% 95%   No intake or output data in the 24 hours ending 06/06/20 0842 There were no vitals filed for this visit.  Examination:  General exam: Appears calm and comfortable  Respiratory system: Clear to auscultation. Respiratory effort normal. Cardiovascular  system: S1 & S2 heard, RRR. No JVD, murmurs, rubs, gallops or clicks. No pedal edema. Gastrointestinal system: Abdomen is nondistended, soft and nontender. No organomegaly or masses felt. Normal bowel sounds heard. Central nervous system: Alert and oriented. No focal neurological deficits. Extremities: Symmetric 5 x 5 power. Skin: No rashes, lesions or ulcers Psychiatry: Judgement and insight appear normal. Mood & affect appropriate.   Data Reviewed: I have personally reviewed following labs and imaging studies  CBC: Recent Labs  Lab 06/05/20 1518 06/06/20 0633  WBC 3.7* 2.7*  NEUTROABS  --  1.5*  HGB 16.4* 15.6*  HCT 48.0* 49.0*  MCV 84.2 87.0  PLT 155 536*   Basic Metabolic Panel: Recent Labs  Lab 06/05/20 1518 06/05/20 1805 06/06/20 0633  NA 140  --  138  K 3.6  --  3.7  CL 108  --  106  CO2 19*  --  19*  GLUCOSE 102*  --  234*  BUN 21  --  23  CREATININE 0.83  --  0.76  CALCIUM 8.6*  --  8.5*  MG  --  1.9 1.8  PHOS  --   --  3.7   GFR: CrCl cannot be calculated (Unknown ideal weight.). Liver Function Tests: Recent Labs  Lab 06/05/20 1518 06/06/20 0633  AST 34 45*  ALT 33 43  ALKPHOS 70 71  BILITOT 1.2 1.4*  PROT 6.2* 6.1*  ALBUMIN 3.2* 3.0*   No results for input(s): LIPASE, AMYLASE in the last 168 hours. No results for input(s): AMMONIA in the last 168 hours. Coagulation Profile: No results for input(s): INR, PROTIME in the last 168 hours. Cardiac Enzymes: No results for input(s): CKTOTAL, CKMB, CKMBINDEX, TROPONINI in the last 168 hours. BNP (last 3 results) No results for input(s): PROBNP in the last 8760 hours. HbA1C: Recent Labs    06/06/20 0633  HGBA1C 8.6*   CBG: Recent Labs  Lab 06/05/20 1817 06/05/20 2144  GLUCAP 95 126*   Lipid Profile: Recent Labs    06/05/20 1826  TRIG 119   Thyroid Function Tests: Recent Labs    06/06/20 0633  TSH 0.520   Anemia Panel: Recent Labs    06/05/20 1805 06/06/20 0633  FERRITIN 429*  529*   Sepsis Labs: Recent Labs  Lab 06/05/20 1518 06/05/20 1718 06/05/20 1805  PROCALCITON  --   --  <0.10  LATICACIDVEN 1.0 1.1  --     No results found for this or any previous visit (from the past 240 hour(s)).    Radiology Studies: DG Chest Portable 1 View  Result Date: 06/05/2020 CLINICAL DATA:  COVID-19 positivity EXAM: PORTABLE CHEST 1 VIEW COMPARISON:  11/20/2019 FINDINGS: Cardiac shadow is within normal limits. Patchy airspace opacity is noted in the left base consistent with the given clinical history. No bony abnormality is noted. IMPRESSION: Left basilar airspace opacity consistent with the given clinical history Electronically Signed   By: Inez Catalina M.D.   On: 06/05/2020 16:30   Scheduled Meds: . apixaban  5 mg Oral BID  . aspirin  81 mg Oral Daily  . dexamethasone  6  mg Oral Daily  . flecainide  75 mg Oral Q12H  . insulin aspart  0-6 Units Subcutaneous TID WC  . Ipratropium-Albuterol  1 puff Inhalation Q6H WA  . linagliptin  5 mg Oral Daily  . pramipexole  0.5 mg Oral TID   Continuous Infusions:   LOS: 1 day   Time spent: 72min  Ayelet Gruenewald C Xiana Carns, DO [facility]  If 7PM-7AM, please contact night-coverage [email or web address]  06/06/2020, 8:42 AM

## 2020-06-07 LAB — GLUCOSE, CAPILLARY
Glucose-Capillary: 258 mg/dL — ABNORMAL HIGH (ref 70–99)
Glucose-Capillary: 288 mg/dL — ABNORMAL HIGH (ref 70–99)
Glucose-Capillary: 294 mg/dL — ABNORMAL HIGH (ref 70–99)
Glucose-Capillary: 300 mg/dL — ABNORMAL HIGH (ref 70–99)
Glucose-Capillary: 364 mg/dL — ABNORMAL HIGH (ref 70–99)

## 2020-06-07 MED ORDER — PREDNISONE 10 MG PO TABS: ORAL_TABLET | ORAL | 0 refills | Status: AC

## 2020-06-07 NOTE — Progress Notes (Signed)
PROGRESS NOTE    MYRKA Kelly  SEG:[id number] DOB: [birth year] DOA: 06/05/2020 PCP: Janie Morning, DO   Brief Narrative:  Cynthia Kelly is a 80 y.o. female with medical history significant for coronary artery disease status post PCI with stent placement to the LAD in July 2021, paroxysmal atrial fibrillation chronically anticoagulated on Eliquis, type 2 diabetes mellitus, status post nephrectomy, who is admitted to [facility] on 06/05/2020 with severe COVID-19 pneumonia after presenting from home to [facility] Emergency Department complaining of shortness of breath. The following history is provided via my discussions with the patient as well as her daughter in addition to my discussions with the emergency department physician and via chart review. The patient reports 5 to 6 days of progressive shortness of breath associated with new onset productive cough over that timeframe.  She also reports associated subjective fever and chills in the absence of full body rigors.  Denies any associated chest pain, palpitations, diaphoresis, nausea, vomiting, diarrhea, abdominal pain, or rash.  She reports that her shortness of breath is not been associated with any orthopnea, PND, or worsening peripheral edema. COVID-19 testing positive as of 05/31/2020 - presented to [facility] for further evaluation of worsening of symptoms. EMS reportedly noted the patient's initial oxygen saturation to be in the high 70s on room air, prompting placement of 3 L nasal cannula upon which the patient arrived at [facility] emergency department.  Assessment & Plan:   Principal Problem:   COVID-19 virus infection Active Problems:   SOB (shortness of breath)   Generalized weakness   Diabetes mellitus without complication (HCC)   CAD (coronary artery disease)   Paroxysmal atrial fibrillation (HCC)  Acute hypoxia secondary to COVID-19 pneumonia, resolving - Continue steroids - Discontinue remdesivir given transient  hypoxia that has resolved since admission - No indication for baricitinib - Covid inflammatory markers minimally elevated - likely indicating good prognosis and early resolution - CXR - personally reviewed minimal bibasilar opacities - likely resolving inflammation Recent Labs    06/05/20 1805 06/06/20 0633  DDIMER 0.54*  --   FERRITIN 429* 529*  LDH 185 206*  CRP 3.0* 2.9*   Ambulatory dysfunction, worsening  -Early this morning patient is somewhat more sluggish, not confused or altered but poorly responsive, unable to participate with PT -current recommendations are for SNF, family hopeful for discharge home with home health in the next few days.  CAD, stable  Type 2 diabetes mellitus Lab Results  Component Value Date   HGBA1C 8.6 (H) 06/06/2020  - Uncontrolled - Continue to monitor closely while on steroids - Glucose quite labile today - continue SSI/Hypoglycemic protocoll  Paroxysmal atrial fibrillation:  - CHA2DS2-VASc score of 5 on Eliquis  DVT prophylaxis: Eliquis Code Status: Full code Family Communication:  I discussed the patient's case with her daughter (POA), Elysse Polidore, at length - we discussed current goals of care, disposition, and prognosis.  Status is: Inpt  Dispo: The patient is from: home              Anticipated d/c is to: home with home health vs SNF              Anticipated d/c date is: 24-48h              Patient currently not medically stable for discharge  Consultants:   None  Procedures:   None  Antimicrobials:  None   Subjective: Somewhat more somnolent this morning, sluggish to respond but appropriate.  Denies chest pain shortness of breath nausea vomiting diarrhea constipation headache fevers or chills.  Objective: Vitals:   06/06/20 2100 06/07/20 0630 06/07/20 1000 06/07/20 1244  BP: (!) 134/56 134/61    Pulse: (!) 55 (!) 59    Resp: (!) 21 16    Temp:  97.9 F (36.6 C)    TempSrc:      SpO2: 95% 99%  94%  Weight:   88.1  kg   Height:   5\' 1"  (1.549 m)    No intake or output data in the 24 hours ending 06/07/20 1407 Filed Weights   06/07/20 1000  Weight: 88.1 kg   Examination:  General exam: Appears calm and comfortable, somnolent but easily arousable, oriented to person place and time Respiratory system: Clear to auscultation. Respiratory effort normal. Cardiovascular system: S1 & S2 heard, RRR. No JVD, murmurs, rubs, gallops or clicks. No pedal edema. Gastrointestinal system: Abdomen is nondistended, soft and nontender. No organomegaly or masses felt. Normal bowel sounds heard. Central nervous system: Alert and oriented. No focal neurological deficits. Extremities: Symmetric 5 x 5 power. Skin: No rashes, lesions or ulcers  Data Reviewed: I have personally reviewed following labs and imaging studies  CBC: Recent Labs  Lab 06/05/20 1518 06/06/20 0633  WBC 3.7* 2.7*  NEUTROABS  --  1.5*  HGB 16.4* 15.6*  HCT 48.0* 49.0*  MCV 84.2 87.0  PLT 155 254*   Basic Metabolic Panel: Recent Labs  Lab 06/05/20 1518 06/05/20 1805 06/06/20 0633  NA 140  --  138  K 3.6  --  3.7  CL 108  --  106  CO2 19*  --  19*  GLUCOSE 102*  --  234*  BUN 21  --  23  CREATININE 0.83  --  0.76  CALCIUM 8.6*  --  8.5*  MG  --  1.9 1.8  PHOS  --   --  3.7   GFR: Estimated Creatinine Clearance: 57.5 mL/min (by C-G formula based on SCr of 0.76 mg/dL). Liver Function Tests: Recent Labs  Lab 06/05/20 1518 06/06/20 0633  AST 34 45*  ALT 33 43  ALKPHOS 70 71  BILITOT 1.2 1.4*  PROT 6.2* 6.1*  ALBUMIN 3.2* 3.0*   No results for input(s): LIPASE, AMYLASE in the last 168 hours. No results for input(s): AMMONIA in the last 168 hours. Coagulation Profile: No results for input(s): INR, PROTIME in the last 168 hours. Cardiac Enzymes: No results for input(s): CKTOTAL, CKMB, CKMBINDEX, TROPONINI in the last 168 hours. BNP (last 3 results) No results for input(s): PROBNP in the last 8760 hours. HbA1C: Recent  Labs    06/06/20 0633  HGBA1C 8.6*   CBG: Recent Labs  Lab 06/05/20 2144 06/06/20 0927 06/06/20 1241 06/06/20 1708 06/07/20 1026  GLUCAP 126* 256* 224* 392* 258*   Lipid Profile: Recent Labs    06/05/20 1826  TRIG 119   Thyroid Function Tests: Recent Labs    06/06/20 0633  TSH 0.520   Anemia Panel: Recent Labs    06/05/20 1805 06/06/20 0633  FERRITIN 429* 529*   Sepsis Labs: Recent Labs  Lab 06/05/20 1518 06/05/20 1718 06/05/20 1805  PROCALCITON  --   --  <0.10  LATICACIDVEN 1.0 1.1  --     Recent Results (from the past 240 hour(s))  Blood Culture (routine x 2)     Status: None (Preliminary result)   Collection Time: 06/05/20  6:05 PM   Specimen: BLOOD RIGHT WRIST  Result Value  Ref Range Status   Specimen Description BLOOD RIGHT WRIST  Final   Special Requests   Final    BOTTLES DRAWN AEROBIC AND ANAEROBIC Blood Culture results may not be optimal due to an inadequate volume of blood received in culture bottles   Culture   Final    NO GROWTH < 24 HOURS Performed at [facility] [address], [place], [place] [address]    Report Status PENDING  Incomplete  Blood Culture (routine x 2)     Status: None (Preliminary result)   Collection Time: 06/05/20  6:24 PM   Specimen: BLOOD LEFT HAND  Result Value Ref Range Status   Specimen Description BLOOD LEFT HAND  Final   Special Requests   Final    BOTTLES DRAWN AEROBIC AND ANAEROBIC Blood Culture results may not be optimal due to an inadequate volume of blood received in culture bottles   Culture   Final    NO GROWTH < 24 HOURS Performed at [facility], [place] [address], [place], [place] [address]    Report Status PENDING  Incomplete   Radiology Studies: DG Chest Portable 1 View  Result Date: 06/05/2020 CLINICAL DATA:  COVID-19 positivity EXAM: PORTABLE CHEST 1 VIEW COMPARISON:  11/20/2019 FINDINGS: Cardiac shadow is within normal limits. Patchy airspace opacity is noted in the left base  consistent with the given clinical history. No bony abnormality is noted. IMPRESSION: Left basilar airspace opacity consistent with the given clinical history Electronically Signed   By: Inez Catalina M.D.   On: 06/05/2020 16:30   Scheduled Meds: . apixaban  5 mg Oral BID  . aspirin  81 mg Oral Daily  . dexamethasone  6 mg Oral Daily  . flecainide  75 mg Oral Q12H  . insulin aspart  0-6 Units Subcutaneous TID WC  . Ipratropium-Albuterol  1 puff Inhalation Q6H WA  . linagliptin  5 mg Oral Daily  . pramipexole  0.5 mg Oral TID    LOS: 2 days   Time spent: 64min  Aivan Fillingim C Dellia Donnelly, DO [facility]  If 7PM-7AM, please contact night-coverage [email or web address]  06/07/2020, 2:07 PM

## 2020-06-07 NOTE — Evaluation (Addendum)
Physical Therapy Evaluation Patient Details Name: Cynthia Kelly MRN: [id number] DOB: 1940-06-20 Today's Date: 06/07/2020   History of Present Illness  80 yo admitted 1/25 with SOB due to Covid PNA after initial positive on 1/20. PMhx: CAD, AFib, DM, nephrectomy  Clinical Impression  Pt sitting in recliner on arrival with flat affect and relatively blank stare. Pt answered name and stated "[facility]" for location, unable to state day, month or situation. Pt able to stand and pivot with incontinence and reports extreme fatigue and would not attempt to take further steps despite max encouragement. Pt lives alone and cannot even ambulate in room at this time making return home unsafe. Pt with decreased strength, cognition, transfers, gait and function who will benefit from acute therapy to maximize mobility, safety and independence.  SpO2 94% on RA.    Follow Up Recommendations SNF;Supervision/Assistance - 24 hour    Equipment Recommendations  3in1 (PT)    Recommendations for Other Services       Precautions / Restrictions Precautions Precautions: Fall Precaution Comments: incontinent      Mobility  Bed Mobility               General bed mobility comments: in chair on arrival    Transfers Overall transfer level: Needs assistance   Transfers: Sit to/from Stand Sit to Stand: Min assist         General transfer comment: min assist to stand with cues for hand placement and assist to rise, pivot BSC<>recliner with RW  Ambulation/Gait             General Gait Details: unable, pt refused to attempt taking steps beyond pivoting  Stairs            Wheelchair Mobility    Modified Rankin (Stroke Patients Only)       Balance Overall balance assessment: Needs assistance Sitting-balance support: Bilateral upper extremity supported Sitting balance-Leahy Scale: Poor Sitting balance - Comments: bil UE support throughout standing with physical assist                                      Pertinent Vitals/Pain Pain Assessment: No/denies pain    Home Living Family/patient expects to be discharged to:: Private residence Living Arrangements: Alone Available Help at Discharge: Family;Available 24 hours/day Type of Home: Mobile home Home Access: Stairs to enter     Home Layout: One level Home Equipment: Environmental consultant - 2 wheels;Cane - single point;Shower seat;Bedside commode      Prior Function Level of Independence: Independent               Hand Dominance        Extremity/Trunk Assessment   Upper Extremity Assessment Upper Extremity Assessment: Generalized weakness    Lower Extremity Assessment Lower Extremity Assessment: Generalized weakness    Cervical / Trunk Assessment Cervical / Trunk Assessment: Kyphotic  Communication      Cognition Arousal/Alertness: Awake/alert Behavior During Therapy: Flat affect Overall Cognitive Status: Impaired/Different from baseline Area of Impairment: Orientation;Attention;Memory;Following commands;Safety/judgement;Awareness;Problem solving                 Orientation Level: Disoriented to;Time;Place Current Attention Level: Focused Memory: Decreased short-term memory;Decreased recall of precautions Following Commands: Follows one step commands inconsistently;Follows one step commands with increased time Safety/Judgement: Decreased awareness of safety;Decreased awareness of deficits   Problem Solving: Slow processing;Requires verbal cues;Requires tactile cues General Comments: pt with  flat affect, slow to respond, unaware of place/time/situation. Pt able to stand and immediately incontinent despite stating ability to tell when she needs to void      General Comments      Exercises     Assessment/Plan    PT Assessment Patient needs continued PT services  PT Problem List Decreased mobility;Decreased strength;Decreased safety awareness;Decreased activity  tolerance;Decreased cognition;Decreased balance;Decreased knowledge of use of DME       PT Treatment Interventions Gait training;Balance training;Functional mobility training;Therapeutic activities;Cognitive remediation;Stair training;Patient/family education;DME instruction;Therapeutic exercise;Neuromuscular re-education    PT Goals (Current goals can be found in the Care Plan section)  Acute Rehab PT Goals Patient Stated Goal: "I'll do whatever i have to do" PT Goal Formulation: With patient Time For Goal Achievement: 06/21/20 Potential to Achieve Goals: Fair    Frequency Min 3X/week   Barriers to discharge Decreased caregiver support      Co-evaluation               AM-PAC PT "6 Clicks" Mobility  Outcome Measure Help needed turning from your back to your side while in a flat bed without using bedrails?: A Little   Help needed moving to and from a bed to a chair (including a wheelchair)?: A Little Help needed standing up from a chair using your arms (e.g., wheelchair or bedside chair)?: A Little Help needed to walk in hospital room?: A Lot Help needed climbing 3-5 steps with a railing? : Total 6 Click Score: 12    End of Session Equipment Utilized During Treatment: Gait belt Activity Tolerance: Patient limited by fatigue;Patient limited by lethargy Patient left: in chair;with call bell/phone within reach;with chair alarm set Nurse Communication: Mobility status PT Visit Diagnosis: Other abnormalities of gait and mobility (R26.89);Difficulty in walking, not elsewhere classified (R26.2);Muscle weakness (generalized) (M62.81)    Time: 1517-6160 PT Time Calculation (min) (ACUTE ONLY): 27 min   Charges:   PT Evaluation $PT Eval Moderate Complexity: 1 Mod PT Treatments $Therapeutic Activity: 8-22 mins        Jawad Wiacek P, PT Acute Rehabilitation Services Pager: [phone] Office: [phone]   Sandy Salaam Nathaly Dawkins 06/07/2020, 12:58 PM

## 2020-06-07 NOTE — Progress Notes (Signed)
SATURATION QUALIFICATIONS: (This note is used to comply with regulatory documentation for home oxygen)  Patient Saturations on Room Air at Rest = 94%  Patient Saturations on Room Air while Ambulating = unable to ambulate

## 2020-06-08 LAB — BASIC METABOLIC PANEL
Anion gap: 11 (ref 5–15)
BUN: 21 mg/dL (ref 8–23)
CO2: 21 mmol/L — ABNORMAL LOW (ref 22–32)
Calcium: 8.7 mg/dL — ABNORMAL LOW (ref 8.9–10.3)
Chloride: 106 mmol/L (ref 98–111)
Creatinine, Ser: 0.77 mg/dL (ref 0.44–1.00)
GFR, Estimated: >60 mL/min (ref >60)
Glucose, Bld: 151 mg/dL — ABNORMAL HIGH (ref 70–99)
Potassium: 4.1 mmol/L (ref 3.5–5.1)
Sodium: 138 mmol/L (ref 135–145)

## 2020-06-08 LAB — D-DIMER, QUANTITATIVE: D-Dimer, Quant: 0.56 ug/mL-FEU — ABNORMAL HIGH (ref 0.00–0.50)

## 2020-06-08 LAB — CBC
HCT: 48.6 % — ABNORMAL HIGH (ref 36.0–46.0)
Hemoglobin: 15.7 g/dL — ABNORMAL HIGH (ref 12.0–15.0)
MCH: 27.5 pg (ref 26.0–34.0)
MCHC: 32.3 g/dL (ref 30.0–36.0)
MCV: 85.3 fL (ref 80.0–100.0)
Platelets: 188 10*3/uL (ref 150–400)
RBC: 5.7 MIL/uL — ABNORMAL HIGH (ref 3.87–5.11)
RDW: 13 % (ref 11.5–15.5)
WBC: 7.3 10*3/uL (ref 4.0–10.5)
nRBC: 0 % (ref 0.0–0.2)

## 2020-06-08 LAB — C-REACTIVE PROTEIN: CRP: 5.6 mg/dL — ABNORMAL HIGH (ref <1.0)

## 2020-06-08 LAB — GLUCOSE, CAPILLARY
Glucose-Capillary: 146 mg/dL — ABNORMAL HIGH (ref 70–99)
Glucose-Capillary: 240 mg/dL — ABNORMAL HIGH (ref 70–99)
Glucose-Capillary: 373 mg/dL — ABNORMAL HIGH (ref 70–99)
Glucose-Capillary: 290 mg/dL — ABNORMAL HIGH (ref 70–99)

## 2020-06-08 MED ORDER — BOOST / RESOURCE BREEZE PO LIQD CUSTOM
1.0000 | Freq: Three times a day (TID) | ORAL | Status: DC
  Administered 2020-06-08: 1 via ORAL

## 2020-06-08 NOTE — TOC Initial Note (Addendum)
Transition of Care [facility]) - Initial/Assessment Note    Patient Details  Name: Cynthia Kelly MRN: [id number] Date of Birth: 07-23-1940  Transition of Care [facility]) CM/SW Contact:    Angelita Ingles, RN Phone Number: [phone]  06/08/2020, 9:41 AM  Clinical Narrative:                 [facility] consulted for patient with SNF recommendations. Patient does not answer the phone. CM spoke with daughter Mickel Baas and Mickel Baas states that she is sure her mom would not want to go to a facility. Mickel Baas also states that the family could provide 24 hour care but would like to know how long this care would be necessary. Daughter states that she can not make this decision right now and that she has a doctors appointment and would like to call the CM back in about an hour. CM has provided daughter with call back number and will await return call.   [id number] Daughter returned call stating that she is agreeable to have patient placed in SNF for rehab. Would like search in [place] area. CM will work patient up for SNF placement.   Expected Discharge Plan: Skilled Nursing Facility Barriers to Discharge: Continued Medical Work up   Patient Goals and CMS Choice        Expected Discharge Plan and Services Expected Discharge Plan: [place] In-house Referral: NA Discharge Planning Services: CM Consult   Living arrangements for the past 2 months: Single Family Home Expected Discharge Date: 06/07/20                                    Prior Living Arrangements/Services Living arrangements for the past 2 months: Single Family Home Lives with:: Self                   Activities of Daily Living Home Assistive Devices/Equipment: Reacher,Eyeglasses ADL Screening (condition at time of admission) Patient's cognitive ability adequate to safely complete daily activities?: Yes Is the patient deaf or have difficulty hearing?: No Does the patient have difficulty seeing, even when wearing  glasses/contacts?: No Does the patient have difficulty concentrating, remembering, or making decisions?: No Patient able to express need for assistance with ADLs?: Yes Does the patient have difficulty dressing or bathing?: Yes Independently performs ADLs?: No Dressing (OT): Needs assistance Does the patient have difficulty walking or climbing stairs?: Yes Weakness of Legs: Both Weakness of Arms/Hands: Both  Permission Sought/Granted                  Emotional Assessment              Admission diagnosis:  Hypoxia [R09.02] Fatigue, unspecified type [R53.83] COVID-19 virus infection [U07.1] COVID-19 [U07.1] Patient Active Problem List   Diagnosis Date Noted  . COVID-19 virus infection 06/05/2020  . SOB (shortness of breath) 06/05/2020  . Generalized weakness 06/05/2020  . Diabetes mellitus without complication ([place])   . CAD (coronary artery disease)   . Paroxysmal atrial fibrillation ([place])    PCP:  Janie Morning, DO Pharmacy:   [facility] [place], [place] - [address] AT [place] [address] [place] [address] Phone: [phone] Fax: [phone]     Social Determinants of Health (SDOH) Interventions    Readmission Risk Interventions No flowsheet data found.

## 2020-06-08 NOTE — Progress Notes (Signed)
PROGRESS NOTE    Cynthia Kelly  [id number] DOB: 23-Nov-1940 DOA: 06/05/2020 PCP: Janie Morning, DO   Brief Narrative:  Cynthia Kelly is a 80 y.o. female with medical history significant for coronary artery disease status post PCI with stent placement to the LAD in July 2021, paroxysmal atrial fibrillation chronically anticoagulated on Eliquis, type 2 diabetes mellitus, status post nephrectomy, who is admitted to [facility] on 06/05/2020 with severe COVID-19 pneumonia after presenting from home to [facility] Emergency Department complaining of shortness of breath. The following history is provided via my discussions with the patient as well as her daughter in addition to my discussions with the emergency department physician and via chart review. The patient reports 5 to 6 days of progressive shortness of breath associated with new onset productive cough over that timeframe.  She also reports associated subjective fever and chills in the absence of full body rigors.  Denies any associated chest pain, palpitations, diaphoresis, nausea, vomiting, diarrhea, abdominal pain, or rash.  She reports that her shortness of breath is not been associated with any orthopnea, PND, or worsening peripheral edema. COVID-19 testing positive as of 05/31/2020 - [facility] for further evaluation of worsening of symptoms. EMS reportedly noted the patient's initial oxygen saturation to be in the high 70s on room air, prompting placement of 3 L nasal cannula upon which the patient arrived at [facility] emergency department.  Assessment & Plan:   Principal Problem:   COVID-19 virus infection Active Problems:   SOB (shortness of breath)   Generalized weakness   Diabetes mellitus without complication (HCC)   CAD (coronary artery disease)   Paroxysmal atrial fibrillation (HCC)  Acute hypoxia secondary to COVID-19 pneumonia, resolving - Continue steroids - Discontinue remdesivir given transient  hypoxia that has resolved since admission - No indication for baricitinib - Covid inflammatory markers minimally elevated - likely indicating good prognosis and early resolution - CXR - personally reviewed minimal bibasilar opacities - likely resolving inflammation Recent Labs    06/05/20 1805 06/06/20 0633 06/08/20 0444  DDIMER 0.54*  --  0.56*  FERRITIN 429* 529*  --   LDH 185 206*  --   CRP 3.0* 2.9* 5.6*   Ambulatory dysfunction, worsening  -Early this morning patient is somewhat more sluggish, not confused or altered but poorly responsive, unable to participate with PT -current recommendations are for SNF, family hopeful for discharge home with home health in the next few days.  Acute metabolic encephalopathy, secondary to above, ongoing  - Patient much more sluggish today, alert to person and place only; very slow to answer questions appropriately however -High risk for hospital delirium, continue precautions, if she becomes acutely altered would likely initiate Seroquel, will discuss with staff possibility for family visitation if patient becomes delirious  CAD, stable  Type 2 diabetes mellitus, uncontrolled Lab Results  Component Value Date   HGBA1C 8.6 (H) 06/06/2020  - Uncontrolled - Continue to monitor closely while on steroids - Glucose quite labile today - continue SSI/Hypoglycemic protocol  Paroxysmal atrial fibrillation:  - CHA2DS2-VASc score of 5 on Eliquis  DVT prophylaxis: Eliquis Code Status: Full code Family Communication:  I discussed the patient's case with her daughter (POA), Cynthia Kelly, at length - we discussed current goals of care, disposition, and prognosis.  Status is: Inpt  Dispo: The patient is from: Home              Anticipated d/c is to: SNF  Anticipated d/c date is: 24 - 48h              Patient currently not medically stable for discharge  Consultants:   None  Procedures:   None  Antimicrobials:  None    Subjective: Patient even more somnolent this morning, difficult to arouse, alert to person only, unable to answer orientation questions, eventually falls asleep and must be reminded with the question was again without answer.  Review of systems markedly limited..  Objective: Vitals:   06/07/20 1244 06/07/20 1413 06/07/20 2051 06/08/20 0604  BP:  (!) 139/58 131/62 (!) 120/53  Pulse:  70 63 69  Resp:  16 15 14   Temp:   99.3 F (37.4 C) 98.4 F (36.9 C)  TempSrc:      SpO2: 94% 93% 91% (!) 89%  Weight:      Height:       No intake or output data in the 24 hours ending 06/08/20 0728 Filed Weights   06/07/20 1000  Weight: 88.1 kg   Examination:  General exam: Appears calm and comfortable, somnolent but easily arousable, oriented to person place and time Respiratory system: Clear to auscultation. Respiratory effort normal. Cardiovascular system: S1 & S2 heard, RRR. No JVD, murmurs, rubs, gallops or clicks. No pedal edema. Gastrointestinal system: Abdomen is nondistended, soft and nontender. No organomegaly or masses felt. Normal bowel sounds heard. Central nervous system: Alert and oriented. No focal neurological deficits. Extremities: Symmetric 5 x 5 power. Skin: No rashes, lesions or ulcers  Data Reviewed: I have personally reviewed following labs and imaging studies  CBC: Recent Labs  Lab 06/05/20 1518 06/06/20 0633 06/08/20 0444  WBC 3.7* 2.7* 7.3  NEUTROABS  --  1.5*  --   HGB 16.4* 15.6* 15.7*  HCT 48.0* 49.0* 48.6*  MCV 84.2 87.0 85.3  PLT 155 128* 161   Basic Metabolic Panel: Recent Labs  Lab 06/05/20 1518 06/05/20 1805 06/06/20 0633 06/08/20 0444  NA 140  --  138 138  K 3.6  --  3.7 4.1  CL 108  --  106 106  CO2 19*  --  19* 21*  GLUCOSE 102*  --  234* 151*  BUN 21  --  23 21  CREATININE 0.83  --  0.76 0.77  CALCIUM 8.6*  --  8.5* 8.7*  MG  --  1.9 1.8  --   PHOS  --   --  3.7  --    GFR: Estimated Creatinine Clearance: 57.5 mL/min (by C-G  formula based on SCr of 0.77 mg/dL). Liver Function Tests: Recent Labs  Lab 06/05/20 1518 06/06/20 0633  AST 34 45*  ALT 33 43  ALKPHOS 70 71  BILITOT 1.2 1.4*  PROT 6.2* 6.1*  ALBUMIN 3.2* 3.0*   No results for input(s): LIPASE, AMYLASE in the last 168 hours. No results for input(s): AMMONIA in the last 168 hours. Coagulation Profile: No results for input(s): INR, PROTIME in the last 168 hours. Cardiac Enzymes: No results for input(s): CKTOTAL, CKMB, CKMBINDEX, TROPONINI in the last 168 hours. BNP (last 3 results) No results for input(s): PROBNP in the last 8760 hours. HbA1C: Recent Labs    06/06/20 0633  HGBA1C 8.6*   CBG: Recent Labs  Lab 06/07/20 1026 06/07/20 1405 06/07/20 1756 06/07/20 1807 06/07/20 2052  GLUCAP 258* 288* 300* 294* 364*   Lipid Profile: Recent Labs    06/05/20 1826  TRIG 119   Thyroid Function Tests: Recent Labs    06/06/20  3149  TSH 0.520   Anemia Panel: Recent Labs    06/05/20 1805 06/06/20 0633  FERRITIN 429* 529*   Sepsis Labs: Recent Labs  Lab 06/05/20 1518 06/05/20 1718 06/05/20 1805  PROCALCITON  --   --  <0.10  LATICACIDVEN 1.0 1.1  --     Recent Results (from the past 240 hour(s))  Blood Culture (routine x 2)     Status: None (Preliminary result)   Collection Time: 06/05/20  6:05 PM   Specimen: BLOOD RIGHT WRIST  Result Value Ref Range Status   Specimen Description BLOOD RIGHT WRIST  Final   Special Requests   Final    BOTTLES DRAWN AEROBIC AND ANAEROBIC Blood Culture results may not be optimal due to an inadequate volume of blood received in culture bottles   Culture   Final    NO GROWTH 2 DAYS Performed at [facility], [place] [address], [place], [place] [address]    Report Status PENDING  Incomplete  Blood Culture (routine x 2)     Status: None (Preliminary result)   Collection Time: 06/05/20  6:24 PM   Specimen: BLOOD LEFT HAND  Result Value Ref Range Status   Specimen Description BLOOD LEFT  HAND  Final   Special Requests   Final    BOTTLES DRAWN AEROBIC AND ANAEROBIC Blood Culture results may not be optimal due to an inadequate volume of blood received in culture bottles   Culture   Final    NO GROWTH 2 DAYS Performed at [facility], [place] [address], [place],  [address]    Report Status PENDING  Incomplete   Radiology Studies: No results found. Scheduled Meds: . apixaban  5 mg Oral BID  . aspirin  81 mg Oral Daily  . dexamethasone  6 mg Oral Daily  . flecainide  75 mg Oral Q12H  . insulin aspart  0-6 Units Subcutaneous TID WC  . Ipratropium-Albuterol  1 puff Inhalation Q6H WA  . linagliptin  5 mg Oral Daily  . pramipexole  0.5 mg Oral TID    LOS: 3 days   Time spent: 17min  William C Lancaster, DO [facility]  If 7PM-7AM, please contact night-coverage [email or web address]  06/08/2020, 7:28 AM

## 2020-06-08 NOTE — Progress Notes (Signed)
Inpatient Diabetes Program Recommendations  AACE/ADA: New Consensus Statement on Inpatient Glycemic Control (2015)  Target Ranges:  Prepandial:   less than 140 mg/dL      Peak postprandial:   less than 180 mg/dL (1-2 hours)      Critically ill patients:  140 - 180 mg/dL   Lab Results  Component Value Date   GLUCAP 146 (H) 06/08/2020   HGBA1C 8.6 (H) 06/06/2020    Review of Glycemic Control  -  Increase Novolog Correction scale to 0-9 units tid + hs scale -  Add Novolog 3 units tid meal coverage if eating >50% of meals (glucose increases after meal intake) Note pt may d/c within 24-48 hours.  Thanks,  Tama Headings RN, MSN, BC-ADM Inpatient Diabetes Coordinator Team Pager [phone] (8a-5p)

## 2020-06-08 NOTE — Evaluation (Signed)
Occupational Therapy Evaluation Patient Details Name: Cynthia Kelly MRN: [id number] DOB: 01-Jan-1941 Today's Date: 06/08/2020    History of Present Illness 80 yo admitted 1/25 with SOB due to Covid PNA after initial positive on 1/20. PMhx: CAD, AFib, DM, nephrectomy   Clinical Impression   PTA, pt was independent and living alone with family close by. Pt currently requiring Supervision-Min Guard A for ADLs and functional mobility using RW. Pt presenting with decreased cognition, balance, and activity tolerance. Pt very motivated to participate in therapy. Pt would benefit from further acute OT to facilitate safe dc. Recommend dc to SNF for further OT to optimize safety, independence with ADLs, and return to PLOF. However, pending progress, pt may be able to dc to home with [facility] and initial support by family.     Follow Up Recommendations  SNF;Other (comment) (Pending progress, may be able to dc to home with HHOT and initial 24/7 support)    Equipment Recommendations  3 in 1 bedside commode;Other (comment) (RW)    Recommendations for Other Services PT consult     Precautions / Restrictions Precautions Precautions: Fall      Mobility Bed Mobility               General bed mobility comments: In recliner upon arrival    Transfers Overall transfer level: Needs assistance Equipment used: None Transfers: Sit to/from Stand Sit to Stand: Min guard         General transfer comment: Min Guard A for safety    Balance Overall balance assessment: Needs assistance Sitting-balance support: No upper extremity supported;Feet supported Sitting balance-Leahy Scale: Fair     Standing balance support: No upper extremity supported;During functional activity Standing balance-Leahy Scale: Fair                             ADL either performed or assessed with clinical judgement   ADL Overall ADL's : Needs assistance/impaired Eating/Feeding: Set up;Supervision/  safety;Sitting   Grooming: Oral care;Wash/dry face;Set up;Supervision/safety;Standing   Upper Body Bathing: Supervision/ safety;Set up;Sitting   Lower Body Bathing: Min guard;Sit to/from stand   Upper Body Dressing : Supervision/safety;Set up;Sitting   Lower Body Dressing: Min guard;Sit to/from stand   Toilet Transfer: Min guard           Functional mobility during ADLs: Min guard General ADL Comments: Pt performing ADLs at [employer] A level. Presenting with decreased balance, acivity tolerance, and cogniton but feels she is progressing well towards baseline     Vision Baseline Vision/History: Wears glasses Wears Glasses: Reading only Patient Visual Report: No change from baseline       Perception     Praxis      Pertinent Vitals/Pain Pain Assessment: No/denies pain     Hand Dominance Right   Extremity/Trunk Assessment Upper Extremity Assessment Upper Extremity Assessment: Generalized weakness   Lower Extremity Assessment Lower Extremity Assessment: Generalized weakness   Cervical / Trunk Assessment Cervical / Trunk Assessment: Kyphotic   Communication Communication Communication: HOH   Cognition Arousal/Alertness: Awake/alert Behavior During Therapy: WFL for tasks assessed/performed Overall Cognitive Status: Impaired/Different from baseline Area of Impairment: Following commands;Awareness;Problem solving;Attention;Memory;Safety/judgement                   Current Attention Level: Sustained Memory: Decreased short-term memory Following Commands: Follows one step commands with increased time;Follows multi-step commands inconsistently Safety/Judgement: Decreased awareness of safety Awareness: Intellectual;Emergent Problem Solving: Slow processing;Requires verbal cues General  Comments: Pt requiring increased time for processing. Benefits from simple direct cues. Pt with good questions such as "what is COVID fog" after therapist described  pt's cognition this way to family over the phone; with explaination, pt with poor awareness to confirm that she requiring more time to think and was confused previously.   General Comments  SpO2 96-94% on RA    Exercises     Shoulder Instructions      Home Living Family/patient expects to be discharged to:: Private residence Living Arrangements: Alone Available Help at Discharge: Family;Available 24 hours/day Type of Home: Mobile home Home Access: Ramped entrance     Home Layout: One level     Bathroom Shower/Tub: Occupational psychologist: Standard     Home Equipment: Environmental consultant - 2 wheels;Cane - single point;Shower seat - built in (shower seat in a weird location under shower head)          Prior Functioning/Environment Level of Independence: Independent        Comments: ADL,s IADLs, and driving. has a girl come clean. Occasionally uses a cane        OT Problem List: Decreased strength;Decreased range of motion;Decreased activity tolerance;Impaired balance (sitting and/or standing);Decreased knowledge of use of DME or AE;Decreased knowledge of precautions;Decreased cognition;Decreased safety awareness;Cardiopulmonary status limiting activity      OT Treatment/Interventions: Self-care/ADL training;Therapeutic exercise;Energy conservation;DME and/or AE instruction;Therapeutic activities;Patient/family education    OT Goals(Current goals can be found in the care plan section) Acute Rehab OT Goals Patient Stated Goal: "It feels good to get up and move" OT Goal Formulation: With patient Time For Goal Achievement: 06/22/20 Potential to Achieve Goals: Good  OT Frequency: Min 2X/week   Barriers to D/C:            Co-evaluation              AM-PAC OT "6 Clicks" Daily Activity     Outcome Measure Help from another person eating meals?: A Little Help from another person taking care of personal grooming?: A Little Help from another person toileting, which  includes using toliet, bedpan, or urinal?: A Little Help from another person bathing (including washing, rinsing, drying)?: A Little Help from another person to put on and taking off regular upper body clothing?: A Little Help from another person to put on and taking off regular lower body clothing?: A Little 6 Click Score: 18   End of Session Equipment Utilized During Treatment: Rolling walker;Gait belt Nurse Communication: Mobility status  Activity Tolerance: Patient tolerated treatment well Patient left: in chair;with call bell/phone within reach;with chair alarm set  OT Visit Diagnosis: Unsteadiness on feet (R26.81);Other abnormalities of gait and mobility (R26.89);Muscle weakness (generalized) (M62.81)                Time: 3086-5784 OT Time Calculation (min): 38 min Charges:  OT General Charges $OT Visit: 1 Visit OT Evaluation $OT Eval Moderate Complexity: 1 Mod OT Treatments $Self Care/Home Management : 8-22 mins  Markavious Micco MSOT, OTR/L Acute Rehab Pager: [phone] Office: [place] 06/08/2020, 5:39 PM

## 2020-06-09 LAB — GLUCOSE, CAPILLARY
Glucose-Capillary: 162 mg/dL — ABNORMAL HIGH (ref 70–99)
Glucose-Capillary: 151 mg/dL — ABNORMAL HIGH (ref 70–99)
Glucose-Capillary: 232 mg/dL — ABNORMAL HIGH (ref 70–99)
Glucose-Capillary: 400 mg/dL — ABNORMAL HIGH (ref 70–99)

## 2020-06-09 LAB — BASIC METABOLIC PANEL
Anion gap: 9 (ref 5–15)
BUN: 24 mg/dL — ABNORMAL HIGH (ref 8–23)
CO2: 24 mmol/L (ref 22–32)
Calcium: 8.7 mg/dL — ABNORMAL LOW (ref 8.9–10.3)
Chloride: 101 mmol/L (ref 98–111)
Creatinine, Ser: 0.87 mg/dL (ref 0.44–1.00)
GFR, Estimated: >60 mL/min (ref >60)
Glucose, Bld: 279 mg/dL — ABNORMAL HIGH (ref 70–99)
Potassium: 3.8 mmol/L (ref 3.5–5.1)
Sodium: 134 mmol/L — ABNORMAL LOW (ref 135–145)

## 2020-06-09 LAB — D-DIMER, QUANTITATIVE: D-Dimer, Quant: 0.99 ug/mL-FEU — ABNORMAL HIGH (ref 0.00–0.50)

## 2020-06-09 LAB — CBC
HCT: 44.1 % (ref 36.0–46.0)
Hemoglobin: 14.9 g/dL (ref 12.0–15.0)
MCH: 28.3 pg (ref 26.0–34.0)
MCHC: 33.8 g/dL (ref 30.0–36.0)
MCV: 83.7 fL (ref 80.0–100.0)
Platelets: 179 10*3/uL (ref 150–400)
RBC: 5.27 MIL/uL — ABNORMAL HIGH (ref 3.87–5.11)
RDW: 13.1 % (ref 11.5–15.5)
WBC: 6.6 10*3/uL (ref 4.0–10.5)
nRBC: 0 % (ref 0.0–0.2)

## 2020-06-09 LAB — C-REACTIVE PROTEIN: CRP: 8.2 mg/dL — ABNORMAL HIGH (ref <1.0)

## 2020-06-09 MED ORDER — IPRATROPIUM-ALBUTEROL 20-100 MCG/ACT IN AERS
1.0000 | INHALATION_SPRAY | Freq: Two times a day (BID) | RESPIRATORY_TRACT | Status: DC
  Administered 2020-06-09 – 2020-06-10 (×3): 1 via RESPIRATORY_TRACT
  Filled 2020-06-09: qty 4

## 2020-06-09 MED ORDER — INSULIN ASPART 100 UNIT/ML ~~LOC~~ SOLN
5.0000 [IU] | Freq: Once | SUBCUTANEOUS | Status: AC
  Administered 2020-06-09: 5 [IU] via SUBCUTANEOUS

## 2020-06-09 NOTE — Progress Notes (Signed)
PROGRESS NOTE    Cynthia Kelly  [id number] DOB: 1940-09-10 DOA: 06/05/2020 PCP: Janie Morning, DO   Brief Narrative:  Cynthia Kelly is a 80 y.o. female with medical history significant for coronary artery disease status post PCI with stent placement to the LAD in July 2021, paroxysmal atrial fibrillation chronically anticoagulated on Eliquis, type 2 diabetes mellitus, status post nephrectomy, who is admitted to [facility] on 06/05/2020 with COVID-19 pneumonia after presenting from home to [facility] complaining of worsening symptoms of shortness of breath, fatigue, weakness. COVID-19 testing positive as of 05/31/2020 per chart review.   Assessment & Plan:   Principal Problem:   COVID-19 virus infection Active Problems:   SOB (shortness of breath)   Generalized weakness   Diabetes mellitus without complication (HCC)   CAD (coronary artery disease)   Paroxysmal atrial fibrillation (HCC)  Acute hypoxia secondary to COVID-19 pneumonia, resolving - Continue steroids - Discontinue remdesivir given transient hypoxia that has resolved since admission - No indication for baricitinib - Covid inflammatory markers minimally elevated - likely indicating good prognosis and early resolution - CXR - personally reviewed minimal bibasilar opacities - likely resolving inflammation Recent Labs    06/08/20 0444 06/09/20 0241  DDIMER 0.56* 0.99*  CRP 5.6* 8.2*   Ambulatory dysfunction, worsening  -Early this morning patient is somewhat more sluggish, not confused or altered but poorly responsive, unable to participate with PT -current recommendations are for SNF, family hopeful for discharge home with home health in the next few days.  Acute metabolic encephalopathy, secondary to above, ongoing  - Patient much more sluggish today, alert to person and place only; very slow to answer questions appropriately however -High risk for hospital delirium, continue precautions,  if she becomes acutely altered would likely initiate Seroquel, will discuss with staff possibility for family visitation if patient becomes delirious  CAD, stable  Type 2 diabetes mellitus, uncontrolled Lab Results  Component Value Date   HGBA1C 8.6 (H) 06/06/2020  - Uncontrolled - Continue to monitor closely while on steroids - Glucose quite labile today - continue SSI/Hypoglycemic protocol  Paroxysmal atrial fibrillation:  - CHA2DS2-VASc score of 5 on Eliquis  DVT prophylaxis: Eliquis Code Status: Full code Family Communication:  I discussed the patient's case with her daughter (POA), Cynthia Kelly, at length - we discussed current goals of care, disposition, and prognosis.  Status is: Inpt  Dispo: The patient is from: Home              Anticipated d/c is to: SNF              Anticipated d/c date is: 24 - 48h              Patient currently not medically stable for discharge  Consultants:   None  Procedures:   None  Antimicrobials:  None   Subjective: Patient much more awake this morning but still profoundly weak, refusing to eat at bedside this morning, continue to encourage patient to increase activity levels, prone and increased p.o. intake.  She denies chest pain, nausea, vomiting, diarrhea, constipation, headache, fever, chills..  Objective: Vitals:   06/08/20 2005 06/08/20 2209 06/09/20 0609 06/09/20 1221  BP: 114/66 120/68 (!) 133/112 (!) 145/59  Pulse: (!) 54 (!) 48 71 68  Resp: 16 16 16 20   Temp: 97.9 F (36.6 C) 97.8 F (36.6 C) 99.4 F (37.4 C) 98.6 F (37 C)  TempSrc: Oral Oral    SpO2: 100% 90% 90% Marland Kitchen)  80%  Weight:      Height:        Intake/Output Summary (Last 24 hours) at 06/09/2020 1320 Last data filed at 06/08/2020 1500 Gross per 24 hour  Intake -  Output 300 ml  Net -300 ml   Filed Weights   06/07/20 1000  Weight: 88.1 kg   Examination:  General exam: Appears calm and comfortable, A/O x4 Respiratory system: Clear to auscultation.  Respiratory effort normal. Cardiovascular system: S1 & S2 heard, RRR. No JVD, murmurs, rubs, gallops or clicks. No pedal edema. Gastrointestinal system: Abdomen is nondistended, soft and nontender. No organomegaly or masses felt. Normal bowel sounds heard. Central nervous system: Alert and oriented. No focal neurological deficits. Extremities: Symmetric 5 x 5 power. Skin: No rashes, lesions or ulcers  Data Reviewed: I have personally reviewed following labs and imaging studies  CBC: Recent Labs  Lab 06/05/20 1518 06/06/20 0633 06/08/20 0444 06/09/20 0241  WBC 3.7* 2.7* 7.3 6.6  NEUTROABS  --  1.5*  --   --   HGB 16.4* 15.6* 15.7* 14.9  HCT 48.0* 49.0* 48.6* 44.1  MCV 84.2 87.0 85.3 83.7  PLT 155 128* 188 409   Basic Metabolic Panel: Recent Labs  Lab 06/05/20 1518 06/05/20 1805 06/06/20 0633 06/08/20 0444 06/09/20 0241  NA 140  --  138 138 134*  K 3.6  --  3.7 4.1 3.8  CL 108  --  106 106 101  CO2 19*  --  19* 21* 24  GLUCOSE 102*  --  234* 151* 279*  BUN 21  --  23 21 24*  CREATININE 0.83  --  0.76 0.77 0.87  CALCIUM 8.6*  --  8.5* 8.7* 8.7*  MG  --  1.9 1.8  --   --   PHOS  --   --  3.7  --   --    GFR: Estimated Creatinine Clearance: 52.9 mL/min (by C-G formula based on SCr of 0.87 mg/dL). Liver Function Tests: Recent Labs  Lab 06/05/20 1518 06/06/20 0633  AST 34 45*  ALT 33 43  ALKPHOS 70 71  BILITOT 1.2 1.4*  PROT 6.2* 6.1*  ALBUMIN 3.2* 3.0*   No results for input(s): LIPASE, AMYLASE in the last 168 hours. No results for input(s): AMMONIA in the last 168 hours. Coagulation Profile: No results for input(s): INR, PROTIME in the last 168 hours. Cardiac Enzymes: No results for input(s): CKTOTAL, CKMB, CKMBINDEX, TROPONINI in the last 168 hours. BNP (last 3 results) No results for input(s): PROBNP in the last 8760 hours. HbA1C: No results for input(s): HGBA1C in the last 72 hours. CBG: Recent Labs  Lab 06/08/20 1146 06/08/20 1627 06/08/20 2211  06/09/20 0824 06/09/20 1201  GLUCAP 240* 373* 290* 162* 151*   Lipid Profile: No results for input(s): CHOL, HDL, LDLCALC, TRIG, CHOLHDL, LDLDIRECT in the last 72 hours. Thyroid Function Tests: No results for input(s): TSH, T4TOTAL, FREET4, T3FREE, THYROIDAB in the last 72 hours. Anemia Panel: No results for input(s): VITAMINB12, FOLATE, FERRITIN, TIBC, IRON, RETICCTPCT in the last 72 hours. Sepsis Labs: Recent Labs  Lab 06/05/20 1518 06/05/20 1718 06/05/20 1805  PROCALCITON  --   --  <0.10  LATICACIDVEN 1.0 1.1  --     Recent Results (from the past 240 hour(s))  Blood Culture (routine x 2)     Status: None (Preliminary result)   Collection Time: 06/05/20  6:05 PM   Specimen: BLOOD RIGHT WRIST  Result Value Ref Range Status   Specimen  Description BLOOD RIGHT WRIST  Final   Special Requests   Final    BOTTLES DRAWN AEROBIC AND ANAEROBIC Blood Culture results may not be optimal due to an inadequate volume of blood received in culture bottles   Culture   Final    NO GROWTH 3 DAYS Performed at [facility], [place] [address], [place], [place] [address]    Report Status PENDING  Incomplete  Blood Culture (routine x 2)     Status: None (Preliminary result)   Collection Time: 06/05/20  6:24 PM   Specimen: BLOOD LEFT HAND  Result Value Ref Range Status   Specimen Description BLOOD LEFT HAND  Final   Special Requests   Final    BOTTLES DRAWN AEROBIC AND ANAEROBIC Blood Culture results may not be optimal due to an inadequate volume of blood received in culture bottles   Culture   Final    NO GROWTH 3 DAYS Performed at [facility], [place] [address], [place], [place] [address]    Report Status PENDING  Incomplete   Radiology Studies: No results found. Scheduled Meds: . apixaban  5 mg Oral BID  . aspirin  81 mg Oral Daily  . dexamethasone  6 mg Oral Daily  . feeding supplement  1 Container Oral TID BM  . flecainide  75 mg Oral Q12H  . insulin aspart  0-6 Units  Subcutaneous TID WC  . Ipratropium-Albuterol  1 puff Inhalation BID  . linagliptin  5 mg Oral Daily  . pramipexole  0.5 mg Oral TID    LOS: 4 days   Time spent: 62min  Rhyleigh Grassel C Marketa Midkiff, DO [facility]  If 7PM-7AM, please contact night-coverage [email or web address]  06/09/2020, 1:20 PM

## 2020-06-10 LAB — CBC
HCT: 43.4 % (ref 36.0–46.0)
Hemoglobin: 14.8 g/dL (ref 12.0–15.0)
MCH: 28.4 pg (ref 26.0–34.0)
MCHC: 34.1 g/dL (ref 30.0–36.0)
MCV: 83.3 fL (ref 80.0–100.0)
Platelets: 198 10*3/uL (ref 150–400)
RBC: 5.21 MIL/uL — ABNORMAL HIGH (ref 3.87–5.11)
RDW: 13.1 % (ref 11.5–15.5)
WBC: 10.2 10*3/uL (ref 4.0–10.5)
nRBC: 0 % (ref 0.0–0.2)

## 2020-06-10 LAB — CULTURE, BLOOD (ROUTINE X 2)
Culture: NO GROWTH
Culture: NO GROWTH

## 2020-06-10 LAB — BASIC METABOLIC PANEL
Anion gap: 12 (ref 5–15)
BUN: 27 mg/dL — ABNORMAL HIGH (ref 8–23)
CO2: 22 mmol/L (ref 22–32)
Calcium: 8.6 mg/dL — ABNORMAL LOW (ref 8.9–10.3)
Chloride: 100 mmol/L (ref 98–111)
Creatinine, Ser: 0.87 mg/dL (ref 0.44–1.00)
GFR, Estimated: >60 mL/min (ref >60)
Glucose, Bld: 362 mg/dL — ABNORMAL HIGH (ref 70–99)
Potassium: 4.1 mmol/L (ref 3.5–5.1)
Sodium: 134 mmol/L — ABNORMAL LOW (ref 135–145)

## 2020-06-10 LAB — C-REACTIVE PROTEIN: CRP: 12.7 mg/dL — ABNORMAL HIGH (ref <1.0)

## 2020-06-10 LAB — GLUCOSE, CAPILLARY
Glucose-Capillary: 268 mg/dL — ABNORMAL HIGH (ref 70–99)
Glucose-Capillary: 297 mg/dL — ABNORMAL HIGH (ref 70–99)

## 2020-06-10 LAB — D-DIMER, QUANTITATIVE: D-Dimer, Quant: 1.87 ug/mL-FEU — ABNORMAL HIGH (ref 0.00–0.50)

## 2020-06-10 MED ORDER — PREDNISONE 20 MG PO TABS
50.0000 mg | ORAL_TABLET | Freq: Every day | ORAL | Status: DC
  Filled 2020-06-10: qty 2

## 2020-06-10 NOTE — Progress Notes (Signed)
SATURATION QUALIFICATIONS: (This note is used to comply with regulatory documentation for home oxygen)  Patient Saturations on Room Air at Rest =91%  Patient Saturations on Room Air while Ambulating = 90%  Patient Saturations on N/A Liters of oxygen while Ambulating = N/A%  Please briefly explain why patient needs home oxygen:Pt did not c/o of shortness of breath while ambulating

## 2020-06-10 NOTE — Discharge Summary (Signed)
Physician Discharge Summary  Cynthia Kelly [id number] DOB: 1940-11-05 DOA: 06/05/2020  PCP: Janie Morning, DO  Admit date: 06/05/2020 Discharge date: 06/10/2020  Admitted From: Home Disposition:  Home  Recommendations for Outpatient Follow-up:  1. Follow up with PCP in 1-2 weeks 2. Please obtain BMP/CBC in one week 3. Please follow up on the following pending results:  Home Health: Yes  Equipment/Devices: Walker  Discharge Condition:Stable  CODE STATUS:Full  Diet recommendation: Regular diet  Brief/Interim Summary: Cynthia Kelly a 80 y.o.femalewith medical history significant forcoronary artery disease status post PCI with stent placement to the LAD in July 2021, paroxysmal atrial fibrillation chronically anticoagulated on Eliquis, type 2 diabetes mellitus, status post nephrectomy,who is admitted to [facility] on 1/25/2022with COVID-19 pneumoniaafter presenting from home to [facility] Emergency Department complaining of worsening symptoms of shortness of breath, fatigue, weakness. COVID-19 testing positive as of 05/31/2020 per chart review.  Patient admitted as above with acute hypoxia and failure to thrive secondary to covid infection and was profoundly somnolent/weak at admission. Thankfully with supportive care and steroids patient improved drastically - does not require oxygen at rest or with exertion and is otherwise stable and agreeable to discharge home with family. PT recommending HH/PT and patient otherwise to complete steroid taper outpatient. Lengthy discussion about need for ongoing quarantine for 21 days per CDC guidelines. Follow up with PCP in the next 2 weeks as scheduled.  Discharge Diagnoses:  Principal Problem:   COVID-19 virus infection Active Problems:   SOB (shortness of breath)   Generalized weakness   Diabetes mellitus without complication (HCC)   CAD (coronary artery disease)   Paroxysmal atrial fibrillation [facility])    Discharge  Instructions  Discharge Instructions    Call MD for:  difficulty breathing, headache or visual disturbances   Complete by: As directed    Call MD for:  extreme fatigue   Complete by: As directed    Call MD for:  extreme fatigue   Complete by: As directed    Call MD for:  persistant dizziness or light-headedness   Complete by: As directed    Diet Carb Modified   Complete by: As directed    Discharge instructions   Complete by: As directed    ?   Person Under Monitoring Name: Cynthia Kelly  Location: [place] [address]   Infection Prevention Recommendations for Individuals Confirmed to have, or Being Evaluated for, 2019 Novel Coronavirus (COVID-19) Infection Who Receive Care at Home  Individuals who are confirmed to have, or are being evaluated for, COVID-19 should follow the prevention steps below until a healthcare provider or local or state health department says they can return to normal activities.  Stay home except to get medical care You should restrict activities outside your home, except for getting medical care. Do not go to work, school, or public areas, and do not use public transportation or taxis.  Call ahead before visiting your doctor Before your medical appointment, call the healthcare provider and tell them that you have, or are being evaluated for, COVID-19 infection. This will help the healthcare provider's office take steps to keep other people from getting infected. Ask your healthcare provider to call the local or state health department.  Monitor your symptoms Seek prompt medical attention if your illness is worsening (e.g., difficulty breathing). Before going to your medical appointment, call the healthcare provider and tell them that you have, or are being evaluated for, COVID-19 infection. Ask your  healthcare provider to call the local or state health department.  Wear a facemask You should wear a facemask that  covers your nose and mouth when you are in the same room with other people and when you visit a healthcare provider. People who live with or visit you should also wear a facemask while they are in the same room with you.  Separate yourself from other people in your home As much as possible, you should stay in a different room from other people in your home. Also, you should use a separate bathroom, if available.  Avoid sharing household items You should not share dishes, drinking glasses, cups, eating utensils, towels, bedding, or other items with other people in your home. After using these items, you should wash them thoroughly with soap and water.  Cover your coughs and sneezes Cover your mouth and nose with a tissue when you cough or sneeze, or you can cough or sneeze into your sleeve. Throw used tissues in a lined trash can, and immediately wash your hands with soap and water for at least 20 seconds or use an alcohol-based hand rub.  Wash your [facility] your hands often and thoroughly with soap and water for at least 20 seconds. You can use an alcohol-based hand sanitizer if soap and water are not available and if your hands are not visibly dirty. Avoid touching your eyes, nose, and mouth with unwashed hands.   Prevention Steps for Caregivers and Household Members of Individuals Confirmed to have, or Being Evaluated for, COVID-19 Infection Being Cared for in the Home  If you live with, or provide care at home for, a person confirmed to have, or being evaluated for, COVID-19 infection please follow these guidelines to prevent infection:  Follow healthcare provider's instructions Make sure that you understand and can help the patient follow any healthcare provider instructions for all care.  Provide for the patient's basic needs You should help the patient with basic needs in the home and provide support for getting groceries, prescriptions, and other personal needs.  Monitor  the patient's symptoms If they are getting sicker, call his or her medical provider and tell them that the patient has, or is being evaluated for, COVID-19 infection. This will help the healthcare provider's office take steps to keep other people from getting infected. Ask the healthcare provider to call the local or state health department.  Limit the number of people who have contact with the patient If possible, have only one caregiver for the patient. Other household members should stay in another home or place of residence. If this is not possible, they should stay in another room, or be separated from the patient as much as possible. Use a separate bathroom, if available. Restrict visitors who do not have an essential need to be in the home.  Keep older adults, very young children, and other sick people away from the patient Keep older adults, very young children, and those who have compromised immune systems or chronic health conditions away from the patient. This includes people with chronic heart, lung, or kidney conditions, diabetes, and cancer.  Ensure good ventilation Make sure that shared spaces in the home have good air flow, such as from an air conditioner or an opened window, weather permitting.  Wash your hands often Wash your hands often and thoroughly with soap and water for at least 20 seconds. You can use an alcohol based hand sanitizer if soap and water are not available and if your  hands are not visibly dirty. Avoid touching your eyes, nose, and mouth with unwashed hands. Use disposable paper towels to dry your hands. If not available, use dedicated cloth towels and replace them when they become wet.  Wear a facemask and gloves Wear a disposable facemask at all times in the room and gloves when you touch or have contact with the patient's blood, body fluids, and/or secretions or excretions, such as sweat, saliva, sputum, nasal mucus, vomit, urine, or feces.  Ensure the  mask fits over your nose and mouth tightly, and do not touch it during use. Throw out disposable facemasks and gloves after using them. Do not reuse. Wash your hands immediately after removing your facemask and gloves. If your personal clothing becomes contaminated, carefully remove clothing and launder. Wash your hands after handling contaminated clothing. Place all used disposable facemasks, gloves, and other waste in a lined container before disposing them with other household waste. Remove gloves and wash your hands immediately after handling these items.  Do not share dishes, glasses, or other household items with the patient Avoid sharing household items. You should not share dishes, drinking glasses, cups, eating utensils, towels, bedding, or other items with a patient who is confirmed to have, or being evaluated for, COVID-19 infection. After the person uses these items, you should wash them thoroughly with soap and water.  Wash laundry thoroughly Immediately remove and wash clothes or bedding that have blood, body fluids, and/or secretions or excretions, such as sweat, saliva, sputum, nasal mucus, vomit, urine, or feces, on them. Wear gloves when handling laundry from the patient. Read and follow directions on labels of laundry or clothing items and detergent. In general, wash and dry with the warmest temperatures recommended on the label.  Clean all areas the individual has used often Clean all touchable surfaces, such as counters, tabletops, doorknobs, bathroom fixtures, toilets, phones, keyboards, tablets, and bedside tables, every day. Also, clean any surfaces that may have blood, body fluids, and/or secretions or excretions on them. Wear gloves when cleaning surfaces the patient has come in contact with. Use a diluted bleach solution (e.g., dilute bleach with 1 part bleach and 10 parts water) or a household disinfectant with a label that says EPA-registered for coronaviruses. To make  a bleach solution at home, add 1 tablespoon of bleach to 1 quart (4 cups) of water. For a larger supply, add  cup of bleach to 1 gallon (16 cups) of water. Read labels of cleaning products and follow recommendations provided on product labels. Labels contain instructions for safe and effective use of the cleaning product including precautions you should take when applying the product, such as wearing gloves or eye protection and making sure you have good ventilation during use of the product. Remove gloves and wash hands immediately after cleaning.  Monitor yourself for signs and symptoms of illness Caregivers and household members are considered close contacts, should monitor their health, and will be asked to limit movement outside of the home to the extent possible. Follow the monitoring steps for close contacts listed on the symptom monitoring form.   ? If you have additional questions, contact your local health department or call the epidemiologist on call at [phone] (available 24/7). ? This guidance is subject to change. For the most up-to-date guidance from CDC, please refer to their website: YouBlogs.pl   Increase activity slowly   Complete by: As directed    Increase activity slowly   Complete by: As directed  Allergies as of 06/10/2020      Reactions   Aspirin Hives   Morphine Shortness Of Breath   Other reaction(s): Confusion (intolerance), Other (See Comments) disorientedddrives me crazy   Metformin    Abdominal cramping-intense   Morphine And Related    Betadine [povidone Iodine] Rash   Latex Rash   Povidone-iodine Rash   Tape Rash, Other (See Comments)   adhesive Band aids      Medication List    STOP taking these medications   doxycycline 100 MG capsule Commonly known as: VIBRAMYCIN     TAKE these medications   accu-chek soft touch lancets 1 each in the morning and at bedtime.    dapagliflozin propanediol 10 MG Tabs tablet Commonly known as: FARXIGA Take 10 mg by mouth daily.   Eliquis 5 MG Tabs tablet Generic drug: apixaban Take 5 mg by mouth 2 (two) times daily.   flecainide 50 MG tablet Commonly known as: TAMBOCOR Take 75 mg by mouth in the morning and at bedtime.   furosemide 20 MG tablet Commonly known as: LASIX Take 20 mg by mouth daily.   glipiZIDE 5 MG 24 hr tablet Commonly known as: GLUCOTROL XL Take 5 mg by mouth daily.   nitroGLYCERIN 0.4 MG SL tablet Commonly known as: NITROSTAT Place 0.4 mg under the tongue daily as needed for chest pain.   ondansetron 4 MG tablet Commonly known as: ZOFRAN Take 4 mg by mouth every 8 (eight) hours as needed for nausea or vomiting.   OVER THE COUNTER MEDICATION Take 1 tablet by mouth daily. ivermectin   pramipexole 1 MG tablet Commonly known as: MIRAPEX Take 0.5-1 mg by mouth See admin instructions. 0.5 mg in the am. 1mg  hs   predniSONE 10 MG tablet Commonly known as: DELTASONE Take 4 tablets (40 mg total) by mouth daily for 3 days, THEN 3 tablets (30 mg total) daily for 3 days, THEN 2 tablets (20 mg total) daily for 3 days, THEN 1 tablet (10 mg total) daily for 3 days. Start taking on: June 07, 2020   spironolactone 25 MG tablet Commonly known as: ALDACTONE Take 12.5 mg by mouth daily.   vitamin C 500 MG tablet Commonly known as: ASCORBIC ACID Take 500 mg by mouth daily.   Vitamin D3 Gummies 25 MCG (1000 UT) Chew Generic drug: Cholecalciferol Chew 1 tablet by mouth daily.   ZINC PO Take 1 tablet by mouth daily.       Follow-up Information    [facility] Follow up.   Why: For home health services. For any concerns call Malachy Mood [phone] Contact information: [place] [address] [phone]              Allergies  Allergen Reactions  . Aspirin Hives  . Morphine Shortness Of Breath    Other reaction(s): Confusion (intolerance),  Other (See Comments) disorientedddrives me crazy   . Metformin      Abdominal cramping-intense  . Morphine And Related   . Betadine [Povidone Iodine] Rash  . Latex Rash  . Povidone-Iodine Rash  . Tape Rash and Other (See Comments)    adhesive Band aids    Consultations:  None   Procedures/Studies: DG Chest Portable 1 View  Result Date: 06/05/2020 CLINICAL DATA:  COVID-19 positivity EXAM: PORTABLE CHEST 1 VIEW COMPARISON:  11/20/2019 FINDINGS: Cardiac shadow is within normal limits. Patchy airspace opacity is noted in the left base consistent with the given clinical history. No bony abnormality  is noted. IMPRESSION: Left basilar airspace opacity consistent with the given clinical history Electronically Signed   By: Inez Catalina M.D.   On: 06/05/2020 16:30     Subjective: No acute issues/events overnight. Much more awake and alert this am -improved ambulation and not hypoxic with ambulation. Requesting discharge home.   Discharge Exam: Vitals:   06/10/20 0323 06/10/20 0917  BP: 118/64 (!) 128/55  Pulse: (!) 51   Resp: 14   Temp: 97.8 F (36.6 C)   SpO2: (!) 85% 98%   Vitals:   06/09/20 1221 06/09/20 2002 06/10/20 0323 06/10/20 0917  BP: (!) 145/59 (!) 115/49 118/64 (!) 128/55  Pulse: 68 63 (!) 51   Resp: 20 20 14    Temp: 98.6 F (37 C) 98.2 F (36.8 C) 97.8 F (36.6 C)   TempSrc:  Oral    SpO2: (!) 80% (!) 83% (!) 85% 98%  Weight:      Height:        General: Pt is alert, awake, not in acute distress Cardiovascular: RRR, S1/S2 +, no rubs, no gallops Respiratory: CTA bilaterally, no wheezing, no rhonchi Abdominal: Soft, NT, ND, bowel sounds + Extremities: no edema, no cyanosis    The results of significant diagnostics from this hospitalization (including imaging, microbiology, ancillary and laboratory) are listed below for reference.     Microbiology: Recent Results (from the past 240 hour(s))  Blood Culture (routine x 2)     Status: None   Collection  Time: 06/05/20  6:05 PM   Specimen: BLOOD RIGHT WRIST  Result Value Ref Range Status   Specimen Description BLOOD RIGHT WRIST  Final   Special Requests   Final    BOTTLES DRAWN AEROBIC AND ANAEROBIC Blood Culture results may not be optimal due to an inadequate volume of blood received in culture bottles   Culture   Final    NO GROWTH 5 DAYS Performed at [facility], [place] [address], [place], [place] [address]    Report Status 06/10/2020 FINAL  Final  Blood Culture (routine x 2)     Status: None   Collection Time: 06/05/20  6:24 PM   Specimen: BLOOD LEFT HAND  Result Value Ref Range Status   Specimen Description BLOOD LEFT HAND  Final   Special Requests   Final    BOTTLES DRAWN AEROBIC AND ANAEROBIC Blood Culture results may not be optimal due to an inadequate volume of blood received in culture bottles   Culture   Final    NO GROWTH 5 DAYS Performed at [facility], [place] [address], [place], [place] [address]    Report Status 06/10/2020 FINAL  Final     Labs: BNP (last 3 results) No results for input(s): BNP in the last 8760 hours. Basic Metabolic Panel: Recent Labs  Lab 06/05/20 1518 06/05/20 1805 06/06/20 0633 06/08/20 0444 06/09/20 0241 06/10/20 0135  NA 140  --  138 138 134* 134*  K 3.6  --  3.7 4.1 3.8 4.1  CL 108  --  106 106 101 100  CO2 19*  --  19* 21* 24 22  GLUCOSE 102*  --  234* 151* 279* 362*  BUN 21  --  23 21 24* 27*  CREATININE 0.83  --  0.76 0.77 0.87 0.87  CALCIUM 8.6*  --  8.5* 8.7* 8.7* 8.6*  MG  --  1.9 1.8  --   --   --   PHOS  --   --  3.7  --   --   --    Liver Function Tests: Recent Labs  Lab 06/05/20 1518 06/06/20 0633  AST 34 45*  ALT 33 43  ALKPHOS 70 71  BILITOT 1.2 1.4*  PROT 6.2* 6.1*  ALBUMIN 3.2* 3.0*   No results for input(s): LIPASE, AMYLASE in the last 168 hours. No results for input(s): AMMONIA in the last 168 hours. CBC: Recent Labs  Lab 06/05/20 1518 06/06/20 0633 06/08/20 0444 06/09/20 0241  06/10/20 0135  WBC 3.7* 2.7* 7.3 6.6 10.2  NEUTROABS  --  1.5*  --   --   --   HGB 16.4* 15.6* 15.7* 14.9 14.8  HCT 48.0* 49.0* 48.6* 44.1 43.4  MCV 84.2 87.0 85.3 83.7 83.3  PLT 155 128* 188 179 198   Cardiac Enzymes: No results for input(s): CKTOTAL, CKMB, CKMBINDEX, TROPONINI in the last 168 hours. BNP: Invalid input(s): POCBNP CBG: Recent Labs  Lab 06/09/20 1201 06/09/20 1650 06/09/20 2006 06/10/20 0813 06/10/20 1212  GLUCAP 151* 232* 400* 268* 297*   D-Dimer Recent Labs    06/09/20 0241 06/10/20 0135  DDIMER 0.99* 1.87*   Hgb A1c No results for input(s): HGBA1C in the last 72 hours. Lipid Profile No results for input(s): CHOL, HDL, LDLCALC, TRIG, CHOLHDL, LDLDIRECT in the last 72 hours. Thyroid function studies No results for input(s): TSH, T4TOTAL, T3FREE, THYROIDAB in the last 72 hours.  Invalid input(s): FREET3 Anemia work up No results for input(s): VITAMINB12, FOLATE, FERRITIN, TIBC, IRON, RETICCTPCT in the last 72 hours. Urinalysis    Component Value Date/Time   COLORURINE AMBER (A) 06/05/2020 1746   APPEARANCEUR HAZY (A) 06/05/2020 1746   LABSPEC 1.031 (H) 06/05/2020 1746   PHURINE 5.0 06/05/2020 1746   GLUCOSEU >=500 (A) 06/05/2020 1746   HGBUR NEGATIVE 06/05/2020 1746   BILIRUBINUR NEGATIVE 06/05/2020 1746   KETONESUR 20 (A) 06/05/2020 1746   PROTEINUR >=300 (A) 06/05/2020 1746   NITRITE NEGATIVE 06/05/2020 1746   LEUKOCYTESUR NEGATIVE 06/05/2020 1746   Sepsis Labs Invalid input(s): PROCALCITONIN,  WBC,  LACTICIDVEN Microbiology Recent Results (from the past 240 hour(s))  Blood Culture (routine x 2)     Status: None   Collection Time: 06/05/20  6:05 PM   Specimen: BLOOD RIGHT WRIST  Result Value Ref Range Status   Specimen Description BLOOD RIGHT WRIST  Final   Special Requests   Final    BOTTLES DRAWN AEROBIC AND ANAEROBIC Blood Culture results may not be optimal due to an inadequate volume of blood received in culture bottles   Culture    Final    NO GROWTH 5 DAYS Performed at [facility], [place] [address], [place], [place] [address]    Report Status 06/10/2020 FINAL  Final  Blood Culture (routine x 2)     Status: None   Collection Time: 06/05/20  6:24 PM   Specimen: BLOOD LEFT HAND  Result Value Ref Range Status   Specimen Description BLOOD LEFT HAND  Final   Special Requests   Final    BOTTLES DRAWN AEROBIC AND ANAEROBIC Blood Culture results may not be optimal due to an inadequate volume of blood received in culture bottles   Culture   Final    NO GROWTH 5 DAYS Performed at [facility], [place] [address] [place], [place] [address]    Report Status 06/10/2020 FINAL  Final     Time coordinating discharge: Over 30 minutes  SIGNED:   Little Ishikawa, DO [facility] 06/10/2020, 3:55 PM Pager   If 7PM-7AM, please contact night-coverage [email or web address]

## 2020-06-10 NOTE — TOC Transition Note (Signed)
Transition of Care [facility]) - CM/SW Discharge Note   Patient Details  Name: Cynthia Kelly MRN: [id number] Date of Birth: June 01, 1940  Transition of Care [facility] CM/SW Contact:  Carles Collet, RN Phone Number: 06/10/2020, 2:21 PM   Clinical Narrative:    Damaris Schooner w patient's daughter. She confirmed that patient will DC to home. [place] services arranged through [employer]. Discussed therapies and added RN and OT to PT. Start of care expected tomorrow. HH will reach out to daughter.  No DME needed.     Final next level of care: Home w Home Health Services Barriers to Discharge: No Barriers Identified   Patient Goals and CMS Choice   CMS Medicare.gov Compare Post Acute Care list provided to:: Other (Comment Required) Choice offered to / list presented to : Adult Children  Discharge Placement                       Discharge Plan and Services In-house Referral: NA Discharge Planning Services: CM Consult            DME Arranged: N/A         HH Arranged: PT,OT,RN [place] Date HH Agency Contacted: 06/10/20 Time [place]: 1419 Representative spoke with at [place]: [place] Determinants of Health ([place]) Interventions     Readmission Risk Interventions No flowsheet data found.

## 2020-06-10 NOTE — NC FL2 (Addendum)
[place] LEVEL OF CARE SCREENING TOOL     IDENTIFICATION  Patient Name: Cynthia Kelly Birthdate: 1941-01-07 Sex: female Admission Date (Current Location): 06/05/2020  [facility] and [place] Number:  [employer] and Address:  [facility] [place]. [facility], [place] [address], [place], [place] [address]      Provider Number: [id number]  Attending Physician Name and Address:  Little Ishikawa, MD  Relative Name and Phone Number:       Current Level of Care: Hospital Recommended Level of Care: [place] Prior Approval Number:    Date Approved/Denied:   PASRR Number: [id number]  Discharge Plan: SNF    Current Diagnoses: Patient Active Problem List   Diagnosis Date Noted  . COVID-19 virus infection 06/05/2020  . SOB (shortness of breath) 06/05/2020  . Generalized weakness 06/05/2020  . Diabetes mellitus without complication ([place])   . CAD (coronary artery disease)   . Paroxysmal atrial fibrillation (HCC)     Orientation RESPIRATION BLADDER Height & Weight     Self,Time,Situation,Place  O2 ([place] 2.5L) Continent Weight: 194 lb 3.6 oz (88.1 kg) Height:  5\' 1"  (154.9 cm)  BEHAVIORAL SYMPTOMS/MOOD NEUROLOGICAL BOWEL NUTRITION STATUS      Continent Diet (regular)  AMBULATORY STATUS COMMUNICATION OF NEEDS Skin   Limited Assist Verbally Normal                       Personal Care Assistance Level of Assistance  Bathing,Feeding,Dressing Bathing Assistance: Limited assistance Feeding assistance: Limited assistance Dressing Assistance: Limited assistance     Functional Limitations Info             SPECIAL CARE FACTORS FREQUENCY  PT (By licensed PT),OT (By licensed OT)     PT Frequency: 5x/wk OT Frequency: 5x/wk            Contractures Contractures Info: Not present    Additional Factors Info  Code Status,Allergies, Isolation Precautions Code Status Info: Full Allergies Info: Aspirin, Morphine, Metformin,  Morphine And Related, Betadine (Povidone Iodine), Latex, Povidone-iodine, Tape  Isolation Precautions: Airborne/Contact Precautions: COVID-19         Current Medications (06/10/2020):  This is the current hospital active medication list Current Facility-Administered Medications  Medication Dose Route Frequency Provider Last Rate Last Admin  . albuterol (VENTOLIN HFA) 108 (90 Base) MCG/ACT inhaler 1-2 puff  1-2 puff Inhalation Q4H PRN Howerter, Justin B, DO   2 puff at 06/07/20 1035  . apixaban (ELIQUIS) tablet 5 mg  5 mg Oral BID Howerter, Justin B, DO   5 mg at 06/10/20 0912  . aspirin chewable tablet 81 mg  81 mg Oral Daily Howerter, Justin B, DO   81 mg at 06/10/20 0909  . feeding supplement (BOOST / RESOURCE BREEZE) liquid 1 Container  1 Container Oral TID BM Little Ishikawa, MD   Given at 06/10/20 [phone]  . flecainide (TAMBOCOR) tablet 75 mg  75 mg Oral Q12H Howerter, Justin B, DO   75 mg at 06/10/20 0911  . insulin aspart (novoLOG) injection 0-6 Units  0-6 Units Subcutaneous TID WC Howerter, Justin B, DO   3 Units at 06/10/20 0907  . Ipratropium-Albuterol (COMBIVENT) respimat 1 puff  1 puff Inhalation BID Little Ishikawa, MD   1 puff at 06/10/20 0912  . linagliptin (TRADJENTA) tablet 5 mg  5 mg Oral Daily Howerter, Justin B, DO   5 mg at 06/10/20 0911  . pramipexole (MIRAPEX) tablet 0.5 mg  0.5 mg Oral TID Howerter, Justin B, DO   0.5 mg at 06/10/20 0909  . [START ON 06/11/2020] predniSONE (DELTASONE) tablet 50 mg  50 mg Oral Q breakfast Little Ishikawa, MD         Discharge Medications: Please see discharge summary for a list of discharge medications.  Relevant Imaging Results:  Relevant Lab Results:   Additional Information SS#: [id number]; Patient is not vaccinated  Geralynn Ochs, LCSW

## 2020-06-10 NOTE — Discharge Instructions (Signed)
Information on my medicine - ELIQUIS® (apixaban) ° °Why was Eliquis® prescribed for you? °Eliquis® was prescribed for you to reduce the risk of a blood clot forming that can cause a stroke if you have a medical condition called atrial fibrillation (a type of irregular heartbeat). ° °What do You need to know about Eliquis® ? °Take your Eliquis® TWICE DAILY - one tablet in the morning and one tablet in the evening with or without food. If you have difficulty swallowing the tablet whole please discuss with your pharmacist how to take the medication safely. ° °Take Eliquis® exactly as prescribed by your doctor and DO NOT stop taking Eliquis® without talking to the doctor who prescribed the medication.  Stopping may increase your risk of developing a stroke.  Refill your prescription before you run out. ° °After discharge, you should have regular check-up appointments with your healthcare provider that is prescribing your Eliquis®.  In the future your dose may need to be changed if your kidney function or weight changes by a significant amount or as you get older. ° °What do you do if you miss a dose? °If you miss a dose, take it as soon as you remember on the same day and resume taking twice daily.  Do not take more than one dose of ELIQUIS at the same time to make up a missed dose. ° °Important Safety Information °A possible side effect of Eliquis® is bleeding. You should call your healthcare provider right away if you experience any of the following: °? Bleeding from an injury or your nose that does not stop. °? Unusual colored urine (red or dark [place]) or unusual colored stools (red or black). °? Unusual bruising for unknown reasons. °? A serious fall or if you hit your head (even if there is no bleeding). ° °Some medicines may interact with Eliquis® and might increase your risk of bleeding or clotting while on Eliquis®. To help avoid this, consult your healthcare provider or pharmacist prior to using any new  prescription or non-prescription medications, including herbals, vitamins, non-steroidal anti-inflammatory drugs (NSAIDs) and supplements. ° °This website has more information on Eliquis® (apixaban): http://www.eliquis.com/eliquis/home ° °

## 2021-11-13 DIAGNOSIS — N390 Urinary tract infection, site not specified: Secondary | ICD-10-CM | POA: Diagnosis not present

## 2021-11-13 DIAGNOSIS — B3731 Acute candidiasis of vulva and vagina: Secondary | ICD-10-CM | POA: Diagnosis not present

## 2021-11-13 DIAGNOSIS — R81 Glycosuria: Secondary | ICD-10-CM | POA: Diagnosis not present

## 2021-11-13 DIAGNOSIS — N3281 Overactive bladder: Secondary | ICD-10-CM | POA: Diagnosis not present

## 2021-11-19 DIAGNOSIS — I517 Cardiomegaly: Secondary | ICD-10-CM | POA: Diagnosis not present

## 2021-11-27 DIAGNOSIS — M1712 Unilateral primary osteoarthritis, left knee: Secondary | ICD-10-CM | POA: Diagnosis not present

## 2021-11-27 DIAGNOSIS — G2581 Restless legs syndrome: Secondary | ICD-10-CM | POA: Diagnosis not present

## 2021-11-27 DIAGNOSIS — C642 Malignant neoplasm of left kidney, except renal pelvis: Secondary | ICD-10-CM | POA: Diagnosis not present

## 2021-11-27 DIAGNOSIS — Z955 Presence of coronary angioplasty implant and graft: Secondary | ICD-10-CM | POA: Diagnosis not present

## 2021-11-27 DIAGNOSIS — I480 Paroxysmal atrial fibrillation: Secondary | ICD-10-CM | POA: Diagnosis not present

## 2021-11-27 DIAGNOSIS — I2510 Atherosclerotic heart disease of native coronary artery without angina pectoris: Secondary | ICD-10-CM | POA: Diagnosis not present

## 2021-11-27 DIAGNOSIS — E1169 Type 2 diabetes mellitus with other specified complication: Secondary | ICD-10-CM | POA: Diagnosis not present

## 2021-11-27 DIAGNOSIS — E785 Hyperlipidemia, unspecified: Secondary | ICD-10-CM | POA: Diagnosis not present

## 2022-01-14 DIAGNOSIS — G4489 Other headache syndrome: Secondary | ICD-10-CM | POA: Diagnosis not present

## 2022-01-14 DIAGNOSIS — R197 Diarrhea, unspecified: Secondary | ICD-10-CM | POA: Diagnosis not present

## 2022-01-14 DIAGNOSIS — E860 Dehydration: Secondary | ICD-10-CM | POA: Diagnosis not present

## 2022-01-14 DIAGNOSIS — R109 Unspecified abdominal pain: Secondary | ICD-10-CM | POA: Diagnosis not present

## 2022-01-14 DIAGNOSIS — N3000 Acute cystitis without hematuria: Secondary | ICD-10-CM | POA: Diagnosis not present

## 2022-01-14 DIAGNOSIS — Z20822 Contact with and (suspected) exposure to covid-19: Secondary | ICD-10-CM | POA: Diagnosis not present

## 2022-01-14 DIAGNOSIS — I4891 Unspecified atrial fibrillation: Secondary | ICD-10-CM | POA: Diagnosis not present

## 2022-01-14 DIAGNOSIS — Z79899 Other long term (current) drug therapy: Secondary | ICD-10-CM | POA: Diagnosis not present

## 2022-01-14 DIAGNOSIS — Z85528 Personal history of other malignant neoplasm of kidney: Secondary | ICD-10-CM | POA: Diagnosis not present

## 2022-01-14 DIAGNOSIS — Z7984 Long term (current) use of oral hypoglycemic drugs: Secondary | ICD-10-CM | POA: Diagnosis not present

## 2022-01-14 DIAGNOSIS — R531 Weakness: Secondary | ICD-10-CM | POA: Diagnosis not present

## 2022-01-14 DIAGNOSIS — R55 Syncope and collapse: Secondary | ICD-10-CM | POA: Diagnosis not present

## 2022-01-14 DIAGNOSIS — I700 Atherosclerosis of aorta: Secondary | ICD-10-CM | POA: Diagnosis not present

## 2022-01-14 DIAGNOSIS — R61 Generalized hyperhidrosis: Secondary | ICD-10-CM | POA: Diagnosis not present

## 2022-01-14 DIAGNOSIS — N390 Urinary tract infection, site not specified: Secondary | ICD-10-CM | POA: Diagnosis not present

## 2022-01-14 DIAGNOSIS — Z905 Acquired absence of kidney: Secondary | ICD-10-CM | POA: Diagnosis not present

## 2022-01-14 DIAGNOSIS — R231 Pallor: Secondary | ICD-10-CM | POA: Diagnosis not present

## 2022-01-14 DIAGNOSIS — R079 Chest pain, unspecified: Secondary | ICD-10-CM | POA: Diagnosis not present

## 2022-01-14 DIAGNOSIS — Z7901 Long term (current) use of anticoagulants: Secondary | ICD-10-CM | POA: Diagnosis not present

## 2022-01-14 DIAGNOSIS — I10 Essential (primary) hypertension: Secondary | ICD-10-CM | POA: Diagnosis not present

## 2022-01-14 DIAGNOSIS — M1990 Unspecified osteoarthritis, unspecified site: Secondary | ICD-10-CM | POA: Diagnosis not present

## 2022-01-14 DIAGNOSIS — E119 Type 2 diabetes mellitus without complications: Secondary | ICD-10-CM | POA: Diagnosis not present

## 2022-01-14 DIAGNOSIS — Z885 Allergy status to narcotic agent status: Secondary | ICD-10-CM | POA: Diagnosis not present

## 2022-01-14 DIAGNOSIS — J90 Pleural effusion, not elsewhere classified: Secondary | ICD-10-CM | POA: Diagnosis not present

## 2022-01-14 DIAGNOSIS — E785 Hyperlipidemia, unspecified: Secondary | ICD-10-CM | POA: Diagnosis not present

## 2022-01-20 DIAGNOSIS — E785 Hyperlipidemia, unspecified: Secondary | ICD-10-CM | POA: Diagnosis not present

## 2022-01-20 DIAGNOSIS — Z905 Acquired absence of kidney: Secondary | ICD-10-CM | POA: Diagnosis not present

## 2022-01-20 DIAGNOSIS — Z7984 Long term (current) use of oral hypoglycemic drugs: Secondary | ICD-10-CM | POA: Diagnosis not present

## 2022-01-20 DIAGNOSIS — E860 Dehydration: Secondary | ICD-10-CM | POA: Diagnosis not present

## 2022-01-20 DIAGNOSIS — I10 Essential (primary) hypertension: Secondary | ICD-10-CM | POA: Diagnosis not present

## 2022-01-20 DIAGNOSIS — M1990 Unspecified osteoarthritis, unspecified site: Secondary | ICD-10-CM | POA: Diagnosis not present

## 2022-01-20 DIAGNOSIS — I4891 Unspecified atrial fibrillation: Secondary | ICD-10-CM | POA: Diagnosis not present

## 2022-01-20 DIAGNOSIS — Z9181 History of falling: Secondary | ICD-10-CM | POA: Diagnosis not present

## 2022-01-20 DIAGNOSIS — N390 Urinary tract infection, site not specified: Secondary | ICD-10-CM | POA: Diagnosis not present

## 2022-01-20 DIAGNOSIS — E119 Type 2 diabetes mellitus without complications: Secondary | ICD-10-CM | POA: Diagnosis not present

## 2022-01-20 DIAGNOSIS — Z7982 Long term (current) use of aspirin: Secondary | ICD-10-CM | POA: Diagnosis not present

## 2022-01-20 DIAGNOSIS — Z7901 Long term (current) use of anticoagulants: Secondary | ICD-10-CM | POA: Diagnosis not present

## 2022-01-20 DIAGNOSIS — Z85828 Personal history of other malignant neoplasm of skin: Secondary | ICD-10-CM | POA: Diagnosis not present

## 2022-01-20 DIAGNOSIS — G9340 Encephalopathy, unspecified: Secondary | ICD-10-CM | POA: Diagnosis not present

## 2022-01-21 DIAGNOSIS — M1990 Unspecified osteoarthritis, unspecified site: Secondary | ICD-10-CM | POA: Diagnosis not present

## 2022-01-21 DIAGNOSIS — Z7982 Long term (current) use of aspirin: Secondary | ICD-10-CM | POA: Diagnosis not present

## 2022-01-21 DIAGNOSIS — I10 Essential (primary) hypertension: Secondary | ICD-10-CM | POA: Diagnosis not present

## 2022-01-21 DIAGNOSIS — Z9181 History of falling: Secondary | ICD-10-CM | POA: Diagnosis not present

## 2022-01-21 DIAGNOSIS — E785 Hyperlipidemia, unspecified: Secondary | ICD-10-CM | POA: Diagnosis not present

## 2022-01-21 DIAGNOSIS — E119 Type 2 diabetes mellitus without complications: Secondary | ICD-10-CM | POA: Diagnosis not present

## 2022-01-21 DIAGNOSIS — Z7984 Long term (current) use of oral hypoglycemic drugs: Secondary | ICD-10-CM | POA: Diagnosis not present

## 2022-01-21 DIAGNOSIS — Z85828 Personal history of other malignant neoplasm of skin: Secondary | ICD-10-CM | POA: Diagnosis not present

## 2022-01-21 DIAGNOSIS — N390 Urinary tract infection, site not specified: Secondary | ICD-10-CM | POA: Diagnosis not present

## 2022-01-21 DIAGNOSIS — Z905 Acquired absence of kidney: Secondary | ICD-10-CM | POA: Diagnosis not present

## 2022-01-21 DIAGNOSIS — G9340 Encephalopathy, unspecified: Secondary | ICD-10-CM | POA: Diagnosis not present

## 2022-01-21 DIAGNOSIS — Z7901 Long term (current) use of anticoagulants: Secondary | ICD-10-CM | POA: Diagnosis not present

## 2022-01-21 DIAGNOSIS — E860 Dehydration: Secondary | ICD-10-CM | POA: Diagnosis not present

## 2022-01-21 DIAGNOSIS — I4891 Unspecified atrial fibrillation: Secondary | ICD-10-CM | POA: Diagnosis not present

## 2022-01-28 DIAGNOSIS — E860 Dehydration: Secondary | ICD-10-CM | POA: Diagnosis not present

## 2022-01-28 DIAGNOSIS — I4891 Unspecified atrial fibrillation: Secondary | ICD-10-CM | POA: Diagnosis not present

## 2022-01-28 DIAGNOSIS — E785 Hyperlipidemia, unspecified: Secondary | ICD-10-CM | POA: Diagnosis not present

## 2022-01-28 DIAGNOSIS — Z9181 History of falling: Secondary | ICD-10-CM | POA: Diagnosis not present

## 2022-01-28 DIAGNOSIS — N390 Urinary tract infection, site not specified: Secondary | ICD-10-CM | POA: Diagnosis not present

## 2022-01-28 DIAGNOSIS — Z7901 Long term (current) use of anticoagulants: Secondary | ICD-10-CM | POA: Diagnosis not present

## 2022-01-28 DIAGNOSIS — M1990 Unspecified osteoarthritis, unspecified site: Secondary | ICD-10-CM | POA: Diagnosis not present

## 2022-01-28 DIAGNOSIS — Z85828 Personal history of other malignant neoplasm of skin: Secondary | ICD-10-CM | POA: Diagnosis not present

## 2022-01-28 DIAGNOSIS — Z7984 Long term (current) use of oral hypoglycemic drugs: Secondary | ICD-10-CM | POA: Diagnosis not present

## 2022-01-28 DIAGNOSIS — I10 Essential (primary) hypertension: Secondary | ICD-10-CM | POA: Diagnosis not present

## 2022-01-28 DIAGNOSIS — G9340 Encephalopathy, unspecified: Secondary | ICD-10-CM | POA: Diagnosis not present

## 2022-01-28 DIAGNOSIS — E119 Type 2 diabetes mellitus without complications: Secondary | ICD-10-CM | POA: Diagnosis not present

## 2022-01-28 DIAGNOSIS — Z7982 Long term (current) use of aspirin: Secondary | ICD-10-CM | POA: Diagnosis not present

## 2022-01-28 DIAGNOSIS — Z905 Acquired absence of kidney: Secondary | ICD-10-CM | POA: Diagnosis not present

## 2022-01-30 DIAGNOSIS — R051 Acute cough: Secondary | ICD-10-CM | POA: Diagnosis not present

## 2022-01-30 DIAGNOSIS — E1165 Type 2 diabetes mellitus with hyperglycemia: Secondary | ICD-10-CM | POA: Diagnosis not present

## 2022-01-30 DIAGNOSIS — Z7689 Persons encountering health services in other specified circumstances: Secondary | ICD-10-CM | POA: Diagnosis not present

## 2022-01-30 DIAGNOSIS — N3000 Acute cystitis without hematuria: Secondary | ICD-10-CM | POA: Diagnosis not present

## 2022-01-31 DIAGNOSIS — N3000 Acute cystitis without hematuria: Secondary | ICD-10-CM | POA: Diagnosis not present

## 2022-02-04 DIAGNOSIS — N390 Urinary tract infection, site not specified: Secondary | ICD-10-CM | POA: Diagnosis not present

## 2022-02-04 DIAGNOSIS — E119 Type 2 diabetes mellitus without complications: Secondary | ICD-10-CM | POA: Diagnosis not present

## 2022-02-04 DIAGNOSIS — Z7982 Long term (current) use of aspirin: Secondary | ICD-10-CM | POA: Diagnosis not present

## 2022-02-04 DIAGNOSIS — G9340 Encephalopathy, unspecified: Secondary | ICD-10-CM | POA: Diagnosis not present

## 2022-02-04 DIAGNOSIS — Z905 Acquired absence of kidney: Secondary | ICD-10-CM | POA: Diagnosis not present

## 2022-02-04 DIAGNOSIS — I4891 Unspecified atrial fibrillation: Secondary | ICD-10-CM | POA: Diagnosis not present

## 2022-02-04 DIAGNOSIS — I10 Essential (primary) hypertension: Secondary | ICD-10-CM | POA: Diagnosis not present

## 2022-02-04 DIAGNOSIS — E860 Dehydration: Secondary | ICD-10-CM | POA: Diagnosis not present

## 2022-02-04 DIAGNOSIS — E785 Hyperlipidemia, unspecified: Secondary | ICD-10-CM | POA: Diagnosis not present

## 2022-02-04 DIAGNOSIS — Z7901 Long term (current) use of anticoagulants: Secondary | ICD-10-CM | POA: Diagnosis not present

## 2022-02-04 DIAGNOSIS — Z9181 History of falling: Secondary | ICD-10-CM | POA: Diagnosis not present

## 2022-02-04 DIAGNOSIS — Z85828 Personal history of other malignant neoplasm of skin: Secondary | ICD-10-CM | POA: Diagnosis not present

## 2022-02-04 DIAGNOSIS — Z7984 Long term (current) use of oral hypoglycemic drugs: Secondary | ICD-10-CM | POA: Diagnosis not present

## 2022-02-04 DIAGNOSIS — M1990 Unspecified osteoarthritis, unspecified site: Secondary | ICD-10-CM | POA: Diagnosis not present

## 2022-02-05 DIAGNOSIS — I4891 Unspecified atrial fibrillation: Secondary | ICD-10-CM | POA: Diagnosis not present

## 2022-02-05 DIAGNOSIS — Z85828 Personal history of other malignant neoplasm of skin: Secondary | ICD-10-CM | POA: Diagnosis not present

## 2022-02-05 DIAGNOSIS — Z7901 Long term (current) use of anticoagulants: Secondary | ICD-10-CM | POA: Diagnosis not present

## 2022-02-05 DIAGNOSIS — Z9181 History of falling: Secondary | ICD-10-CM | POA: Diagnosis not present

## 2022-02-05 DIAGNOSIS — M1990 Unspecified osteoarthritis, unspecified site: Secondary | ICD-10-CM | POA: Diagnosis not present

## 2022-02-05 DIAGNOSIS — E860 Dehydration: Secondary | ICD-10-CM | POA: Diagnosis not present

## 2022-02-05 DIAGNOSIS — G9340 Encephalopathy, unspecified: Secondary | ICD-10-CM | POA: Diagnosis not present

## 2022-02-05 DIAGNOSIS — E119 Type 2 diabetes mellitus without complications: Secondary | ICD-10-CM | POA: Diagnosis not present

## 2022-02-05 DIAGNOSIS — Z905 Acquired absence of kidney: Secondary | ICD-10-CM | POA: Diagnosis not present

## 2022-02-05 DIAGNOSIS — N390 Urinary tract infection, site not specified: Secondary | ICD-10-CM | POA: Diagnosis not present

## 2022-02-05 DIAGNOSIS — Z7984 Long term (current) use of oral hypoglycemic drugs: Secondary | ICD-10-CM | POA: Diagnosis not present

## 2022-02-05 DIAGNOSIS — Z7982 Long term (current) use of aspirin: Secondary | ICD-10-CM | POA: Diagnosis not present

## 2022-02-05 DIAGNOSIS — I10 Essential (primary) hypertension: Secondary | ICD-10-CM | POA: Diagnosis not present

## 2022-02-05 DIAGNOSIS — E785 Hyperlipidemia, unspecified: Secondary | ICD-10-CM | POA: Diagnosis not present

## 2022-02-13 DIAGNOSIS — Z9181 History of falling: Secondary | ICD-10-CM | POA: Diagnosis not present

## 2022-02-13 DIAGNOSIS — N390 Urinary tract infection, site not specified: Secondary | ICD-10-CM | POA: Diagnosis not present

## 2022-02-13 DIAGNOSIS — E119 Type 2 diabetes mellitus without complications: Secondary | ICD-10-CM | POA: Diagnosis not present

## 2022-02-13 DIAGNOSIS — Z905 Acquired absence of kidney: Secondary | ICD-10-CM | POA: Diagnosis not present

## 2022-02-13 DIAGNOSIS — E860 Dehydration: Secondary | ICD-10-CM | POA: Diagnosis not present

## 2022-02-13 DIAGNOSIS — I4891 Unspecified atrial fibrillation: Secondary | ICD-10-CM | POA: Diagnosis not present

## 2022-02-13 DIAGNOSIS — Z85828 Personal history of other malignant neoplasm of skin: Secondary | ICD-10-CM | POA: Diagnosis not present

## 2022-02-13 DIAGNOSIS — M1990 Unspecified osteoarthritis, unspecified site: Secondary | ICD-10-CM | POA: Diagnosis not present

## 2022-02-13 DIAGNOSIS — Z7984 Long term (current) use of oral hypoglycemic drugs: Secondary | ICD-10-CM | POA: Diagnosis not present

## 2022-02-13 DIAGNOSIS — I10 Essential (primary) hypertension: Secondary | ICD-10-CM | POA: Diagnosis not present

## 2022-02-13 DIAGNOSIS — G9340 Encephalopathy, unspecified: Secondary | ICD-10-CM | POA: Diagnosis not present

## 2022-02-13 DIAGNOSIS — Z7982 Long term (current) use of aspirin: Secondary | ICD-10-CM | POA: Diagnosis not present

## 2022-02-13 DIAGNOSIS — Z7901 Long term (current) use of anticoagulants: Secondary | ICD-10-CM | POA: Diagnosis not present

## 2022-02-13 DIAGNOSIS — E785 Hyperlipidemia, unspecified: Secondary | ICD-10-CM | POA: Diagnosis not present

## 2022-02-18 DIAGNOSIS — I4891 Unspecified atrial fibrillation: Secondary | ICD-10-CM | POA: Diagnosis not present

## 2022-02-18 DIAGNOSIS — Z9181 History of falling: Secondary | ICD-10-CM | POA: Diagnosis not present

## 2022-02-18 DIAGNOSIS — E119 Type 2 diabetes mellitus without complications: Secondary | ICD-10-CM | POA: Diagnosis not present

## 2022-02-18 DIAGNOSIS — M1990 Unspecified osteoarthritis, unspecified site: Secondary | ICD-10-CM | POA: Diagnosis not present

## 2022-02-18 DIAGNOSIS — N390 Urinary tract infection, site not specified: Secondary | ICD-10-CM | POA: Diagnosis not present

## 2022-02-18 DIAGNOSIS — Z85828 Personal history of other malignant neoplasm of skin: Secondary | ICD-10-CM | POA: Diagnosis not present

## 2022-02-18 DIAGNOSIS — Z7982 Long term (current) use of aspirin: Secondary | ICD-10-CM | POA: Diagnosis not present

## 2022-02-18 DIAGNOSIS — E860 Dehydration: Secondary | ICD-10-CM | POA: Diagnosis not present

## 2022-02-18 DIAGNOSIS — G9340 Encephalopathy, unspecified: Secondary | ICD-10-CM | POA: Diagnosis not present

## 2022-02-18 DIAGNOSIS — I10 Essential (primary) hypertension: Secondary | ICD-10-CM | POA: Diagnosis not present

## 2022-02-18 DIAGNOSIS — Z7901 Long term (current) use of anticoagulants: Secondary | ICD-10-CM | POA: Diagnosis not present

## 2022-02-18 DIAGNOSIS — Z905 Acquired absence of kidney: Secondary | ICD-10-CM | POA: Diagnosis not present

## 2022-02-18 DIAGNOSIS — E785 Hyperlipidemia, unspecified: Secondary | ICD-10-CM | POA: Diagnosis not present

## 2022-02-18 DIAGNOSIS — Z7984 Long term (current) use of oral hypoglycemic drugs: Secondary | ICD-10-CM | POA: Diagnosis not present

## 2022-02-27 DIAGNOSIS — E1165 Type 2 diabetes mellitus with hyperglycemia: Secondary | ICD-10-CM | POA: Diagnosis not present

## 2022-02-27 DIAGNOSIS — C642 Malignant neoplasm of left kidney, except renal pelvis: Secondary | ICD-10-CM | POA: Diagnosis not present

## 2022-02-27 DIAGNOSIS — I480 Paroxysmal atrial fibrillation: Secondary | ICD-10-CM | POA: Diagnosis not present

## 2022-03-11 DIAGNOSIS — E782 Mixed hyperlipidemia: Secondary | ICD-10-CM | POA: Diagnosis not present

## 2022-03-11 DIAGNOSIS — I10 Essential (primary) hypertension: Secondary | ICD-10-CM | POA: Diagnosis not present

## 2022-03-25 DIAGNOSIS — I2510 Atherosclerotic heart disease of native coronary artery without angina pectoris: Secondary | ICD-10-CM | POA: Diagnosis not present

## 2022-03-25 DIAGNOSIS — Z9861 Coronary angioplasty status: Secondary | ICD-10-CM | POA: Diagnosis not present

## 2022-03-25 DIAGNOSIS — I480 Paroxysmal atrial fibrillation: Secondary | ICD-10-CM | POA: Diagnosis not present

## 2022-03-25 DIAGNOSIS — Z7901 Long term (current) use of anticoagulants: Secondary | ICD-10-CM | POA: Diagnosis not present

## 2022-03-25 DIAGNOSIS — I071 Rheumatic tricuspid insufficiency: Secondary | ICD-10-CM | POA: Diagnosis not present

## 2022-03-25 DIAGNOSIS — Z79899 Other long term (current) drug therapy: Secondary | ICD-10-CM | POA: Diagnosis not present

## 2022-05-14 DIAGNOSIS — H40013 Open angle with borderline findings, low risk, bilateral: Secondary | ICD-10-CM | POA: Diagnosis not present

## 2022-05-14 DIAGNOSIS — H25813 Combined forms of age-related cataract, bilateral: Secondary | ICD-10-CM | POA: Diagnosis not present

## 2022-05-14 DIAGNOSIS — H04123 Dry eye syndrome of bilateral lacrimal glands: Secondary | ICD-10-CM | POA: Diagnosis not present

## 2022-05-14 DIAGNOSIS — E119 Type 2 diabetes mellitus without complications: Secondary | ICD-10-CM | POA: Diagnosis not present

## 2022-05-24 DIAGNOSIS — K449 Diaphragmatic hernia without obstruction or gangrene: Secondary | ICD-10-CM | POA: Diagnosis not present

## 2022-05-24 DIAGNOSIS — K5730 Diverticulosis of large intestine without perforation or abscess without bleeding: Secondary | ICD-10-CM | POA: Diagnosis not present

## 2022-05-24 DIAGNOSIS — R9431 Abnormal electrocardiogram [ECG] [EKG]: Secondary | ICD-10-CM | POA: Diagnosis not present

## 2022-05-24 DIAGNOSIS — E785 Hyperlipidemia, unspecified: Secondary | ICD-10-CM | POA: Diagnosis not present

## 2022-05-24 DIAGNOSIS — R410 Disorientation, unspecified: Secondary | ICD-10-CM | POA: Diagnosis not present

## 2022-05-24 DIAGNOSIS — R509 Fever, unspecified: Secondary | ICD-10-CM | POA: Diagnosis not present

## 2022-05-24 DIAGNOSIS — I10 Essential (primary) hypertension: Secondary | ICD-10-CM | POA: Diagnosis not present

## 2022-05-24 DIAGNOSIS — N289 Disorder of kidney and ureter, unspecified: Secondary | ICD-10-CM | POA: Diagnosis not present

## 2022-06-02 DIAGNOSIS — R0609 Other forms of dyspnea: Secondary | ICD-10-CM | POA: Diagnosis not present

## 2022-06-02 DIAGNOSIS — E1169 Type 2 diabetes mellitus with other specified complication: Secondary | ICD-10-CM | POA: Diagnosis not present

## 2022-06-02 DIAGNOSIS — R609 Edema, unspecified: Secondary | ICD-10-CM | POA: Diagnosis not present

## 2022-06-02 DIAGNOSIS — C642 Malignant neoplasm of left kidney, except renal pelvis: Secondary | ICD-10-CM | POA: Diagnosis not present

## 2022-06-02 DIAGNOSIS — E785 Hyperlipidemia, unspecified: Secondary | ICD-10-CM | POA: Diagnosis not present

## 2022-06-02 DIAGNOSIS — I480 Paroxysmal atrial fibrillation: Secondary | ICD-10-CM | POA: Diagnosis not present

## 2022-06-11 DIAGNOSIS — I480 Paroxysmal atrial fibrillation: Secondary | ICD-10-CM | POA: Diagnosis not present

## 2022-06-11 DIAGNOSIS — E1169 Type 2 diabetes mellitus with other specified complication: Secondary | ICD-10-CM | POA: Diagnosis not present

## 2022-07-03 DIAGNOSIS — R81 Glycosuria: Secondary | ICD-10-CM | POA: Diagnosis not present

## 2022-07-03 DIAGNOSIS — N3281 Overactive bladder: Secondary | ICD-10-CM | POA: Diagnosis not present

## 2022-07-03 DIAGNOSIS — B3731 Acute candidiasis of vulva and vagina: Secondary | ICD-10-CM | POA: Diagnosis not present

## 2022-07-03 DIAGNOSIS — N390 Urinary tract infection, site not specified: Secondary | ICD-10-CM | POA: Diagnosis not present

## 2022-07-07 DIAGNOSIS — N12 Tubulo-interstitial nephritis, not specified as acute or chronic: Secondary | ICD-10-CM | POA: Diagnosis not present

## 2022-07-07 DIAGNOSIS — R109 Unspecified abdominal pain: Secondary | ICD-10-CM | POA: Diagnosis not present

## 2022-07-07 DIAGNOSIS — R197 Diarrhea, unspecified: Secondary | ICD-10-CM | POA: Diagnosis not present

## 2022-07-07 DIAGNOSIS — E1165 Type 2 diabetes mellitus with hyperglycemia: Secondary | ICD-10-CM | POA: Diagnosis not present

## 2022-07-10 DIAGNOSIS — E1169 Type 2 diabetes mellitus with other specified complication: Secondary | ICD-10-CM | POA: Diagnosis not present

## 2022-07-10 DIAGNOSIS — I480 Paroxysmal atrial fibrillation: Secondary | ICD-10-CM | POA: Diagnosis not present

## 2022-07-17 DIAGNOSIS — R262 Difficulty in walking, not elsewhere classified: Secondary | ICD-10-CM | POA: Diagnosis not present

## 2022-07-17 DIAGNOSIS — M25561 Pain in right knee: Secondary | ICD-10-CM | POA: Diagnosis not present

## 2022-07-17 DIAGNOSIS — M170 Bilateral primary osteoarthritis of knee: Secondary | ICD-10-CM | POA: Diagnosis not present

## 2022-07-17 DIAGNOSIS — M25562 Pain in left knee: Secondary | ICD-10-CM | POA: Diagnosis not present

## 2022-07-18 DIAGNOSIS — N12 Tubulo-interstitial nephritis, not specified as acute or chronic: Secondary | ICD-10-CM | POA: Diagnosis not present

## 2022-07-18 DIAGNOSIS — R109 Unspecified abdominal pain: Secondary | ICD-10-CM | POA: Diagnosis not present

## 2022-07-24 DIAGNOSIS — M25561 Pain in right knee: Secondary | ICD-10-CM | POA: Diagnosis not present

## 2022-07-24 DIAGNOSIS — M170 Bilateral primary osteoarthritis of knee: Secondary | ICD-10-CM | POA: Diagnosis not present

## 2022-07-24 DIAGNOSIS — M25562 Pain in left knee: Secondary | ICD-10-CM | POA: Diagnosis not present

## 2022-07-24 DIAGNOSIS — R262 Difficulty in walking, not elsewhere classified: Secondary | ICD-10-CM | POA: Diagnosis not present

## 2022-07-31 DIAGNOSIS — M170 Bilateral primary osteoarthritis of knee: Secondary | ICD-10-CM | POA: Diagnosis not present

## 2022-07-31 DIAGNOSIS — R262 Difficulty in walking, not elsewhere classified: Secondary | ICD-10-CM | POA: Diagnosis not present

## 2022-07-31 DIAGNOSIS — M25562 Pain in left knee: Secondary | ICD-10-CM | POA: Diagnosis not present

## 2022-07-31 DIAGNOSIS — M25561 Pain in right knee: Secondary | ICD-10-CM | POA: Diagnosis not present

## 2022-08-01 DIAGNOSIS — D692 Other nonthrombocytopenic purpura: Secondary | ICD-10-CM | POA: Diagnosis not present

## 2022-08-01 DIAGNOSIS — N390 Urinary tract infection, site not specified: Secondary | ICD-10-CM | POA: Diagnosis not present

## 2022-08-01 DIAGNOSIS — R109 Unspecified abdominal pain: Secondary | ICD-10-CM | POA: Diagnosis not present

## 2022-08-06 DIAGNOSIS — R109 Unspecified abdominal pain: Secondary | ICD-10-CM | POA: Diagnosis not present

## 2022-08-08 IMAGING — DX DG CHEST 1V PORT
1 series · 1 of 1 positions shown · non-contrast
Comparison: 11/20/2019

CLINICAL DATA: 2L0O3-VZ positivity

EXAM:
PORTABLE CHEST 1 VIEW

[chest ap]
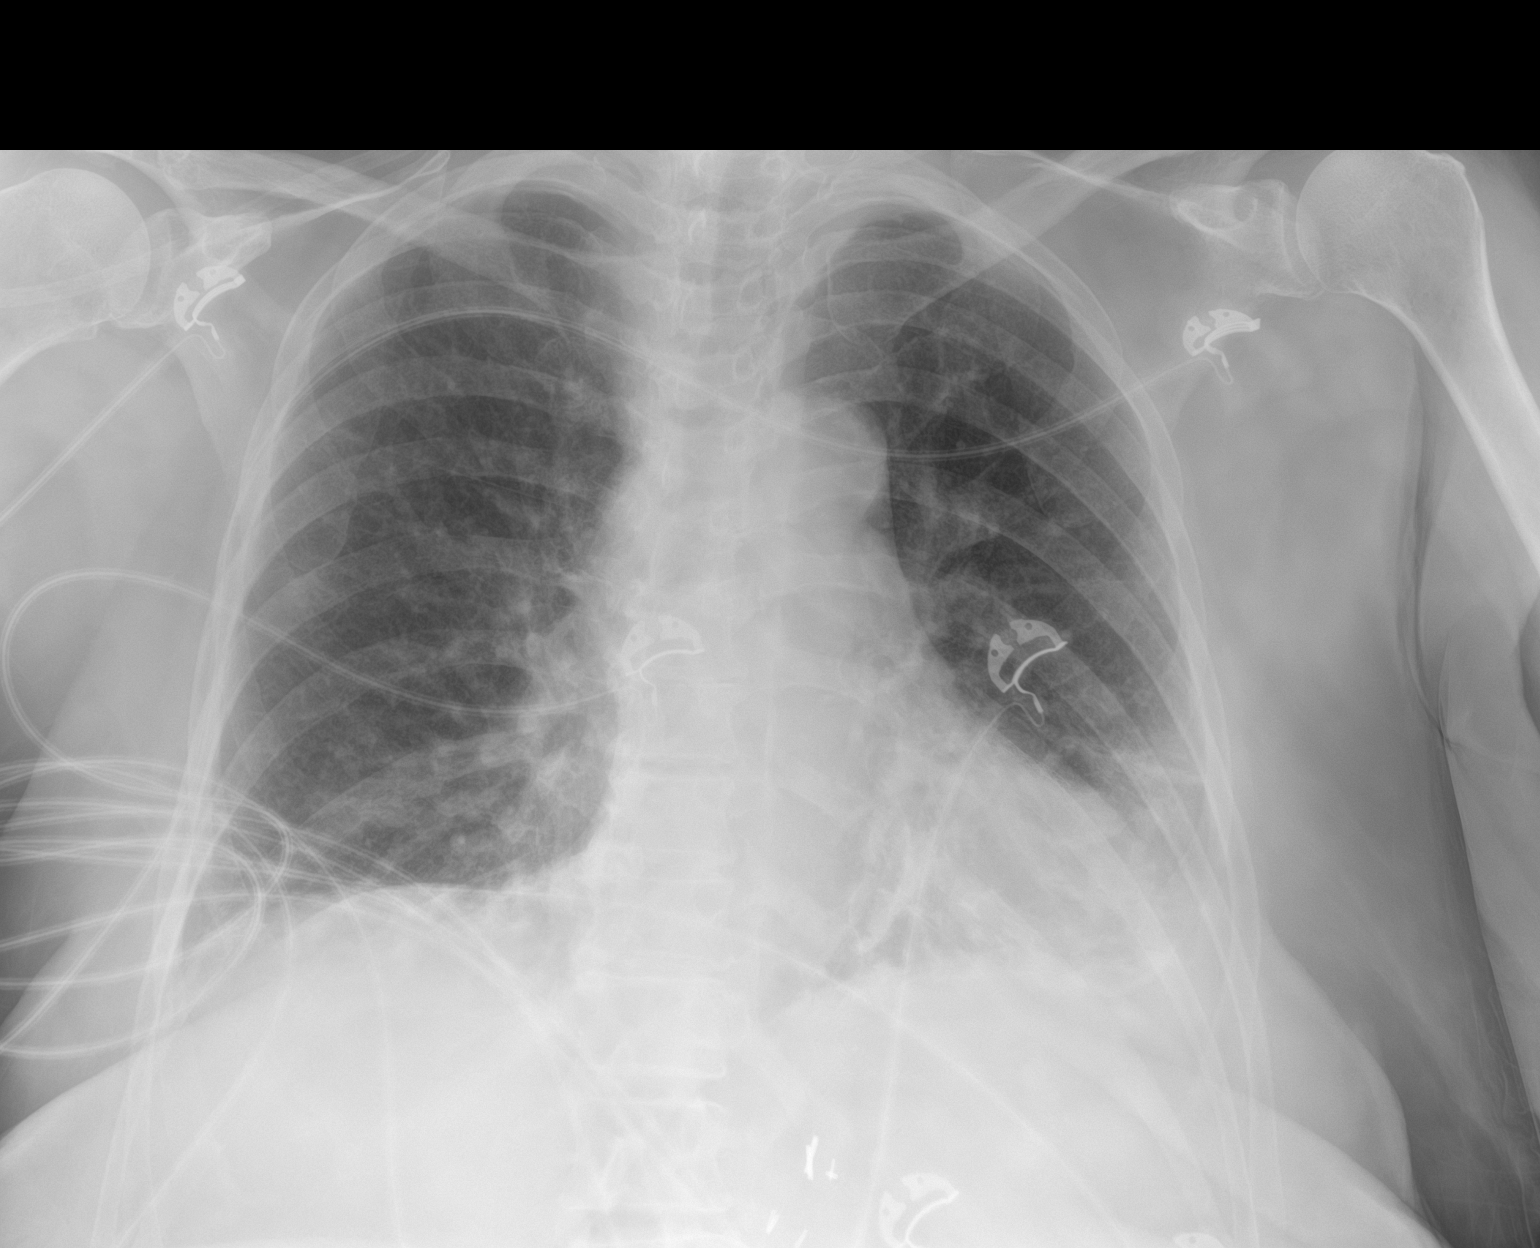

[1 of 1 positions shown; findings below may reference images not displayed]

FINDINGS: Cardiac shadow is within normal limits. Patchy airspace opacity is
noted in the left base consistent with the given clinical history.
No bony abnormality is noted.
IMPRESSION: Left basilar airspace opacity consistent with the given clinical
history

## 2022-08-10 DIAGNOSIS — E1169 Type 2 diabetes mellitus with other specified complication: Secondary | ICD-10-CM | POA: Diagnosis not present

## 2022-08-10 DIAGNOSIS — I480 Paroxysmal atrial fibrillation: Secondary | ICD-10-CM | POA: Diagnosis not present

## 2022-08-19 ENCOUNTER — Ambulatory Visit: Payer: Medicare Other | Admitting: Urology

## 2022-08-19 ENCOUNTER — Encounter: Payer: Self-pay | Admitting: Urology

## 2022-08-19 VITALS — BP 133/79 | HR 72 | Ht 61.0 in | Wt 178.0 lb

## 2022-08-19 DIAGNOSIS — Z85528 Personal history of other malignant neoplasm of kidney: Secondary | ICD-10-CM

## 2022-08-19 DIAGNOSIS — Z87440 Personal history of urinary (tract) infections: Secondary | ICD-10-CM | POA: Diagnosis not present

## 2022-08-19 DIAGNOSIS — R109 Unspecified abdominal pain: Secondary | ICD-10-CM | POA: Diagnosis not present

## 2022-08-19 DIAGNOSIS — N3941 Urge incontinence: Secondary | ICD-10-CM

## 2022-08-19 DIAGNOSIS — R339 Retention of urine, unspecified: Secondary | ICD-10-CM | POA: Diagnosis not present

## 2022-08-19 DIAGNOSIS — M546 Pain in thoracic spine: Secondary | ICD-10-CM

## 2022-08-19 LAB — URINALYSIS, ROUTINE W REFLEX MICROSCOPIC
Bilirubin, UA: NEGATIVE
Ketones, UA: NEGATIVE
Nitrite, UA: NEGATIVE
Protein,UA: NEGATIVE
Specific Gravity, UA: 1.01 (ref 1.005–1.030)
Urobilinogen, Ur: 0.2 mg/dL (ref 0.2–1.0)
pH, UA: 5.5 (ref 5.0–7.5)

## 2022-08-19 LAB — MICROSCOPIC EXAMINATION
Cast Type: NONE SEEN
Casts: NONE SEEN /lpf
Crystal Type: NONE SEEN
Crystals: NONE SEEN
Mucus, UA: NONE SEEN
Trichomonas, UA: NONE SEEN

## 2022-08-19 LAB — BLADDER SCAN AMB NON-IMAGING

## 2022-08-19 MED ORDER — MIRABEGRON ER 50 MG PO TB24: 50.0000 mg | ORAL_TABLET | Freq: Every day | ORAL | 0 refills | Status: DC

## 2022-08-19 NOTE — Progress Notes (Signed)
Assessment: 1. Right-sided thoracic back pain, unspecified chronicity   2. History of UTI   3. History of renal cell cancer   4. Urge incontinence   5. Incomplete bladder emptying     Plan: I personally reviewed the patient's chart including provider notes, lab results. Her right sided back pain is in the upper thoracic area and is not associated with her right kidney.  Recommend evaluation for other causes of her upper right back pain, possible pulmonary source as this began after her diagnosis of pneumonia. No evidence of UTI today. On review of her chart, I do not see any evidence to suggest pyelonephritis. Discontinue Solifenacin due to incomplete bladder emptying. Trial of Myrbetriq 50 mg daily in place of Solifenacin for management of her urge incontinence.  Samples provided. Return to office in 1 month.  Chief Complaint:  Chief Complaint  Patient presents with   Flank Pain    History of Present Illness:  Cynthia Kelly is a 82 y.o. female who is seen in consultation from Buckner Malta, MD for evaluation of right flank pain.  She was seen in the emergency room at [facility] in January 2024 for evaluation of acute mental status changes and possible UTI.  She was actually diagnosed with right lobar pneumonia.  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  Urinalysis showed 4 WBCs and few bacteria.  She has had pain in the right upper back since that time.  She reports that the area is sensitive to touch and is irritated by her clothing.  She does not have any pain in the right flank area.  She has a history of UTIs. Urine culture from 9/23 grew 25-50 K Enterococcus. Urine culture from 07/07/2022 grew 10-25K Enterococcus. Urine culture from 07/18/2022 grew <10K mixed flora.  She has symptoms of urinary frequency, urgency, nocturia 1-2  times, and urge incontinence.  She is using 3 pads per day.  She has been seen by urology in [place].  She has been on Vesicare 10 mg daily for approximately 2 months.  She has not seen improvement in her incontinence with the medication.  No dysuria or gross hematuria.  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  This was performed by Dr. Leroy Libman in [place].    Past Medical History:  Past Medical History:  Diagnosis Date   CAD (coronary artery disease)    stent to LAD in July '21   Diabetes mellitus without complication    Paroxysmal atrial fibrillation    RLS (restless legs syndrome)     Past Surgical History:  Past Surgical History:  Procedure Laterality Date   ABDOMINAL HYSTERECTOMY     APPENDECTOMY     CHOLECYSTECTOMY      Allergies:  Allergies  Allergen Reactions   Aspirin Hives   Morphine Shortness Of Breath    Other reaction(s): Confusion (intolerance), Other (See Comments) disorientedddrives me crazy    Metformin      Abdominal cramping-intense   Morphine And Related    Betadine [Povidone Iodine] Rash   Latex Rash   Povidone-Iodine Rash   Tape Rash and Other (See Comments)    adhesive Band aids    Family History:  No family history on file.  Social History:  Social History   Tobacco Use   Smoking status: Never  Smokeless tobacco: Never  Substance Use Topics   Alcohol use: Not Currently   Drug use: Not Currently    Review of symptoms:  Constitutional:  Negative for unexplained weight loss, night sweats, fever, chills ENT:  Negative for nose bleeds, sinus pain, painful swallowing CV:  Negative for chest pain, shortness of breath, exercise intolerance, palpitations, loss of consciousness Resp:  Negative for cough, wheezing, shortness of breath GI:  Negative for nausea, vomiting, diarrhea, bloody stools GU:  Positives noted in HPI; otherwise negative for gross hematuria, dysuria Neuro:  Negative for seizures, poor  balance, limb weakness, slurred speech Psych:  Negative for lack of energy, depression, anxiety Endocrine:  Negative for polydipsia, polyuria, symptoms of hypoglycemia (dizziness, hunger, sweating) Hematologic:  Negative for anemia, purpura, petechia, prolonged or excessive bleeding, use of anticoagulants  Allergic:  Negative for difficulty breathing or choking as a result of exposure to anything; no shellfish allergy; no allergic response (rash/itch) to materials, foods  Physical exam: BP 133/79   Pulse 72   Ht 5\' 1"  (1.549 m)   Wt 178 lb (80.7 kg)   BMI 33.63 kg/m  GENERAL APPEARANCE:  Well appearing, well developed, well nourished, NAD HEENT: Atraumatic, Normocephalic, oropharynx clear. NECK: Supple without lymphadenopathy or thyromegaly. LUNGS: Clear to auscultation bilaterally. HEART: Regular Rate and Rhythm without murmurs, gallops, or rubs. ABDOMEN: Soft, non-tender, No Masses. EXTREMITIES: Moves all extremities well.  Without clubbing, cyanosis, or edema. NEUROLOGIC:  Alert and oriented x 3, normal gait, CN II-XII grossly intact.  MENTAL STATUS:  Appropriate. BACK:  Non-tender to palpation.  No CVAT SKIN:  Warm, dry and intact.    Results: U/A: 6-10 WBCs, 0-2 RBCs, few bacteria  PVR = 422 ml

## 2022-09-09 DIAGNOSIS — E1169 Type 2 diabetes mellitus with other specified complication: Secondary | ICD-10-CM | POA: Diagnosis not present

## 2022-09-09 DIAGNOSIS — I480 Paroxysmal atrial fibrillation: Secondary | ICD-10-CM | POA: Diagnosis not present

## 2022-09-17 DIAGNOSIS — M170 Bilateral primary osteoarthritis of knee: Secondary | ICD-10-CM | POA: Diagnosis not present

## 2022-09-17 DIAGNOSIS — M25561 Pain in right knee: Secondary | ICD-10-CM | POA: Diagnosis not present

## 2022-09-17 DIAGNOSIS — R262 Difficulty in walking, not elsewhere classified: Secondary | ICD-10-CM | POA: Diagnosis not present

## 2022-09-17 DIAGNOSIS — M25562 Pain in left knee: Secondary | ICD-10-CM | POA: Diagnosis not present

## 2022-09-18 ENCOUNTER — Ambulatory Visit: Payer: Medicare Other | Admitting: Urology

## 2022-09-18 ENCOUNTER — Encounter: Payer: Self-pay | Admitting: Urology

## 2022-09-18 VITALS — BP 179/75 | HR 72 | Ht 61.0 in | Wt 185.0 lb

## 2022-09-18 DIAGNOSIS — Z87440 Personal history of urinary (tract) infections: Secondary | ICD-10-CM

## 2022-09-18 DIAGNOSIS — Z85528 Personal history of other malignant neoplasm of kidney: Secondary | ICD-10-CM

## 2022-09-18 DIAGNOSIS — M546 Pain in thoracic spine: Secondary | ICD-10-CM

## 2022-09-18 DIAGNOSIS — R339 Retention of urine, unspecified: Secondary | ICD-10-CM | POA: Diagnosis not present

## 2022-09-18 DIAGNOSIS — N3941 Urge incontinence: Secondary | ICD-10-CM

## 2022-09-18 LAB — BLADDER SCAN AMB NON-IMAGING

## 2022-09-18 MED ORDER — GEMTESA 75 MG PO TABS
75.0000 mg | ORAL_TABLET | Freq: Every day | ORAL | 0 refills | Status: DC
Start: 2022-09-18 — End: 2022-09-30

## 2022-09-18 NOTE — Progress Notes (Signed)
Assessment: 1. Urge incontinence   2. Right-sided thoracic back pain, unspecified chronicity   3. History of UTI   4. History of renal cell cancer     Plan: Trial of Gemtesa 75 mg daily Information on PTNS provided to patient. Discussed further evaluation of urge incontinence with urodynamics Call with results of medication in 4 weeks   Chief Complaint:  Chief Complaint  Patient presents with   Urinary Incontinence    History of Present Illness:  Cynthia Kelly is a 82 y.o. female who is seen for further evaluation of urge incontinence, incomplete bladder emptying, and history of right flank pain.   She was seen in the emergency room at [facility] in January 2024 for evaluation of acute mental status changes and possible UTI.  She was actually diagnosed with right lobar pneumonia.  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  Urinalysis showed 4 WBCs and few bacteria.  She has had pain in the right upper back since that time.  She reports that the area is sensitive to touch and is irritated by her clothing.  She does not have any pain in the right flank area.  She has a history of UTIs. Urine culture from 9/23 grew 25-50 K Enterococcus. Urine culture from 07/07/2022 grew 10-25K Enterococcus. Urine culture from 07/18/2022 grew <10K mixed flora.  She has symptoms of urinary frequency, urgency, nocturia 1-2 times, and urge incontinence.  She was using 3 pads per day.  She has been seen by urology in [place].  She had been on Vesicare 10 mg daily for approximately 2 months.  She had not seen improvement in her incontinence with the medication.  No dysuria or gross hematuria.  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  This was performed by Dr. Leroy Libman in [place].  PVR last visit was 422 ml. Her solifenacin was  discontinued and she was given a trial of Myrbetriq.  She returns today for follow-up.  She has not seen improvement in her incontinence and frequency and urgency with Myrbetriq.  No dysuria or gross hematuria.  She continues to require pad usage daily.  Portions of the above documentation were copied from a prior visit for review purposes only.   Past Medical History:  Past Medical History:  Diagnosis Date   CAD (coronary artery disease)    stent to LAD in July '21   Diabetes mellitus without complication (HCC)    Paroxysmal atrial fibrillation (HCC)    RLS (restless legs syndrome)     Past Surgical History:  Past Surgical History:  Procedure Laterality Date   ABDOMINAL HYSTERECTOMY     APPENDECTOMY     CHOLECYSTECTOMY      Allergies:  Allergies  Allergen Reactions   Aspirin Hives   Morphine Shortness Of Breath    Other reaction(s): Confusion (intolerance), Other (See Comments) disorientedddrives me crazy    Metformin      Abdominal cramping-intense   Morphine And Related    Betadine [Povidone Iodine] Rash   Latex Rash   Povidone-Iodine Rash   Tape Rash and Other (See Comments)    adhesive Band aids    Family History:  No family history on file.  Social History:  Social History   Tobacco Use   Smoking status: Never   Smokeless tobacco: Never  Substance  Use Topics   Alcohol use: Not Currently   Drug use: Not Currently    ROS: Constitutional:  Negative for fever, chills, weight loss CV: Negative for chest pain, previous MI, hypertension Respiratory:  Negative for shortness of breath, wheezing, sleep apnea, frequent cough GI:  Negative for nausea, vomiting, bloody stool, GERD  Physical exam: BP (!) 179/75   Pulse 72   Ht 5\' 1"  (1.549 m)   Wt 185 lb (83.9 kg)   BMI 34.96 kg/m  GENERAL APPEARANCE:  Well appearing, well developed, well nourished, NAD HEENT:  Atraumatic, normocephalic, oropharynx clear NECK:  Supple without lymphadenopathy or  thyromegaly ABDOMEN:  Soft, non-tender, no masses EXTREMITIES:  Moves all extremities well, without clubbing, cyanosis, or edema NEUROLOGIC:  Alert and oriented x 3, normal gait, CN II-XII grossly intact MENTAL STATUS:  appropriate BACK:  Non-tender to palpation, No CVAT SKIN:  Warm, dry, and intact  Results: U/A: 3+ glucose  PVR = 0 ml

## 2022-09-19 DIAGNOSIS — M549 Dorsalgia, unspecified: Secondary | ICD-10-CM | POA: Diagnosis not present

## 2022-09-19 LAB — URINALYSIS, ROUTINE W REFLEX MICROSCOPIC
Bilirubin, UA: NEGATIVE
Ketones, UA: NEGATIVE
Leukocytes,UA: NEGATIVE
Nitrite, UA: NEGATIVE
Protein,UA: NEGATIVE
RBC, UA: NEGATIVE
Specific Gravity, UA: 1.005 (ref 1.005–1.030)
Urobilinogen, Ur: 0.2 mg/dL (ref 0.2–1.0)
pH, UA: 5.5 (ref 5.0–7.5)

## 2022-09-30 ENCOUNTER — Other Ambulatory Visit: Payer: Self-pay | Admitting: Urology

## 2022-09-30 ENCOUNTER — Telehealth: Payer: Self-pay | Admitting: Urology

## 2022-09-30 DIAGNOSIS — N3941 Urge incontinence: Secondary | ICD-10-CM

## 2022-09-30 MED ORDER — TROSPIUM CHLORIDE 20 MG PO TABS
20.0000 mg | ORAL_TABLET | Freq: Two times a day (BID) | ORAL | 11 refills | Status: DC
Start: 2022-09-30 — End: 2022-12-15

## 2022-09-30 NOTE — Telephone Encounter (Signed)
Patient called and LVM that at her last visit with Dr Pete Glatter, he had given her Leslye Peer and he wanted a call back if the medication was working or not, and patient stated the medication is not working and would like something else called in in the place of [place].  Patients callback #: [phone]

## 2022-09-30 NOTE — Telephone Encounter (Signed)
Notified pt of new Rx. Made 1 mo f/u appt. Pt confirmed and verbalized understanding.

## 2022-10-31 ENCOUNTER — Ambulatory Visit (INDEPENDENT_AMBULATORY_CARE_PROVIDER_SITE_OTHER): Payer: Medicare Other | Admitting: Urology

## 2022-10-31 ENCOUNTER — Encounter: Payer: Self-pay | Admitting: Urology

## 2022-10-31 VITALS — BP 138/77 | HR 65

## 2022-10-31 DIAGNOSIS — Z85528 Personal history of other malignant neoplasm of kidney: Secondary | ICD-10-CM | POA: Diagnosis not present

## 2022-10-31 DIAGNOSIS — Z87440 Personal history of urinary (tract) infections: Secondary | ICD-10-CM | POA: Diagnosis not present

## 2022-10-31 DIAGNOSIS — N3941 Urge incontinence: Secondary | ICD-10-CM

## 2022-10-31 LAB — MICROSCOPIC EXAMINATION
Cast Type: NONE SEEN
Casts: NONE SEEN /lpf
Crystal Type: NONE SEEN
Crystals: NONE SEEN
Mucus, UA: NONE SEEN
Trichomonas, UA: NONE SEEN
Yeast, UA: NONE SEEN

## 2022-10-31 LAB — URINALYSIS, ROUTINE W REFLEX MICROSCOPIC
Bilirubin, UA: NEGATIVE
Ketones, UA: NEGATIVE
Leukocytes,UA: NEGATIVE
Nitrite, UA: NEGATIVE
Protein,UA: NEGATIVE
Specific Gravity, UA: 1.005 (ref 1.005–1.030)
Urobilinogen, Ur: 0.2 mg/dL (ref 0.2–1.0)
pH, UA: 5.5 (ref 5.0–7.5)

## 2022-10-31 LAB — BLADDER SCAN AMB NON-IMAGING

## 2022-10-31 NOTE — Progress Notes (Signed)
Assessment: 1. Urge incontinence   2. History of UTI   3. History of renal cell cancer     Plan: She has failed attempts at medical therapy for her urge incontinence. Schedule for further evaluation of urge incontinence with urodynamics at [facility] Will call with results  Chief Complaint:  Chief Complaint  Patient presents with   Urinary Incontinence    History of Present Illness:  Cynthia Kelly is a 82 y.o. female who is seen for further evaluation of urge incontinence, incomplete bladder emptying, and history of right flank pain.   She was seen in the emergency room at [facility] in January 2024 for evaluation of acute mental status changes and possible UTI.  She was actually diagnosed with right lobar pneumonia.  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  Urinalysis showed 4 WBCs and few bacteria.  She has had pain in the right upper back since that time.  She reports that the area is sensitive to touch and is irritated by her clothing.  She does not have any pain in the right flank area.  She has a history of UTIs. Urine culture from 9/23 grew 25-50 K Enterococcus. Urine culture from 07/07/2022 grew 10-25K Enterococcus. Urine culture from 07/18/2022 grew <10K mixed flora.  She has symptoms of urinary frequency, urgency, nocturia 1-2 times, and urge incontinence.  She was using 7-8 pads per day.  She has been seen by urology in [place].  She had been on Vesicare 10 mg daily for approximately 2 months.  She had not seen improvement in her incontinence with the medication.  No dysuria or gross hematuria.  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  This was performed by Dr. Leroy Libman in [place].  PVR previously was 422 ml. Her solifenacin was discontinued and she was given a trial of Myrbetriq. She did  not notice any improvement in her incontinence, frequency and urgency with Myrbetriq.  No dysuria or gross hematuria.  She continued to require pad usage daily. She was given a trial of Gemtesa 75 mg daily at her visit in May 2024.  She was also given information on PTNS. Unfortunately, the Gemtesa did not improve her symptoms.  She was changed to trospium 20 mg twice daily.  She returns today for follow-up.  She discontinued the trospium approximately 1 week ago as it was not effective in controlling her symptoms.  She continues to have frequency, urgency, and urge incontinence.  She is currently using 7-8 pads/day.  Portions of the above documentation were copied from a prior visit for review purposes only.   Past Medical History:  Past Medical History:  Diagnosis Date   CAD (coronary artery disease)    stent to LAD in July '21   Diabetes mellitus without complication (HCC)    Paroxysmal atrial fibrillation (HCC)    RLS (restless legs syndrome)     Past Surgical History:  Past Surgical History:  Procedure Laterality Date   ABDOMINAL HYSTERECTOMY     APPENDECTOMY     CHOLECYSTECTOMY      Allergies:  Allergies  Allergen Reactions   Aspirin Hives   Morphine Shortness Of Breath    Other reaction(s): Confusion (intolerance), Other (See Comments) disorientedddrives me crazy    Metformin      Abdominal cramping-intense   Morphine  And Codeine    Betadine [Povidone Iodine] Rash   Latex Rash   Povidone-Iodine Rash   Tape Rash and Other (See Comments)    adhesive Band aids    Family History:  No family history on file.  Social History:  Social History   Tobacco Use   Smoking status: Never   Smokeless tobacco: Never  Substance Use Topics   Alcohol use: Not Currently   Drug use: Not Currently    ROS: Constitutional:  Negative for fever, chills, weight loss CV: Negative for chest pain, previous MI, hypertension Respiratory:  Negative for shortness of breath,  wheezing, sleep apnea, frequent cough GI:  Negative for nausea, vomiting, bloody stool, GERD  Physical exam: BP 138/77   Pulse 65  GENERAL APPEARANCE:  Well appearing, well developed, well nourished, NAD HEENT:  Atraumatic, normocephalic, oropharynx clear NECK:  Supple without lymphadenopathy or thyromegaly ABDOMEN:  Soft, non-tender, no masses EXTREMITIES:  Moves all extremities well, without clubbing, cyanosis, or edema NEUROLOGIC:  Alert and oriented x 3, normal gait, CN II-XII grossly intact MENTAL STATUS:  appropriate BACK:  Non-tender to palpation, No CVAT SKIN:  Warm, dry, and intact  Results: U/A: 6-10 WBC, 0-2 RBC, mod bacteria  PVR = 174 ml

## 2022-11-24 DIAGNOSIS — R000 Tachycardia, unspecified: Secondary | ICD-10-CM | POA: Diagnosis not present

## 2022-11-24 DIAGNOSIS — R739 Hyperglycemia, unspecified: Secondary | ICD-10-CM | POA: Diagnosis not present

## 2022-11-24 DIAGNOSIS — R29818 Other symptoms and signs involving the nervous system: Secondary | ICD-10-CM | POA: Diagnosis not present

## 2022-11-24 DIAGNOSIS — R29810 Facial weakness: Secondary | ICD-10-CM | POA: Diagnosis not present

## 2022-11-25 DIAGNOSIS — Z7982 Long term (current) use of aspirin: Secondary | ICD-10-CM | POA: Diagnosis not present

## 2022-11-25 DIAGNOSIS — Z9109 Other allergy status, other than to drugs and biological substances: Secondary | ICD-10-CM | POA: Diagnosis not present

## 2022-11-25 DIAGNOSIS — G459 Transient cerebral ischemic attack, unspecified: Secondary | ICD-10-CM | POA: Diagnosis not present

## 2022-11-25 DIAGNOSIS — I10 Essential (primary) hypertension: Secondary | ICD-10-CM | POA: Diagnosis not present

## 2022-11-25 DIAGNOSIS — E1165 Type 2 diabetes mellitus with hyperglycemia: Secondary | ICD-10-CM | POA: Diagnosis not present

## 2022-11-25 DIAGNOSIS — R29810 Facial weakness: Secondary | ICD-10-CM | POA: Diagnosis not present

## 2022-11-25 DIAGNOSIS — R0602 Shortness of breath: Secondary | ICD-10-CM | POA: Diagnosis not present

## 2022-11-25 DIAGNOSIS — I6782 Cerebral ischemia: Secondary | ICD-10-CM | POA: Diagnosis not present

## 2022-11-25 DIAGNOSIS — Z85528 Personal history of other malignant neoplasm of kidney: Secondary | ICD-10-CM | POA: Diagnosis not present

## 2022-11-25 DIAGNOSIS — E8729 Other acidosis: Secondary | ICD-10-CM | POA: Diagnosis not present

## 2022-11-25 DIAGNOSIS — I480 Paroxysmal atrial fibrillation: Secondary | ICD-10-CM | POA: Diagnosis not present

## 2022-11-25 DIAGNOSIS — Z79899 Other long term (current) drug therapy: Secondary | ICD-10-CM | POA: Diagnosis not present

## 2022-11-25 DIAGNOSIS — R29818 Other symptoms and signs involving the nervous system: Secondary | ICD-10-CM | POA: Diagnosis not present

## 2022-11-25 DIAGNOSIS — Z905 Acquired absence of kidney: Secondary | ICD-10-CM | POA: Diagnosis not present

## 2022-11-25 DIAGNOSIS — R000 Tachycardia, unspecified: Secondary | ICD-10-CM | POA: Diagnosis not present

## 2022-11-25 DIAGNOSIS — E7800 Pure hypercholesterolemia, unspecified: Secondary | ICD-10-CM | POA: Diagnosis not present

## 2022-11-25 DIAGNOSIS — R079 Chest pain, unspecified: Secondary | ICD-10-CM | POA: Diagnosis not present

## 2022-11-25 DIAGNOSIS — G2581 Restless legs syndrome: Secondary | ICD-10-CM | POA: Diagnosis not present

## 2022-11-25 DIAGNOSIS — Z7901 Long term (current) use of anticoagulants: Secondary | ICD-10-CM | POA: Diagnosis not present

## 2022-11-25 DIAGNOSIS — R739 Hyperglycemia, unspecified: Secondary | ICD-10-CM | POA: Diagnosis not present

## 2022-11-25 DIAGNOSIS — Z885 Allergy status to narcotic agent status: Secondary | ICD-10-CM | POA: Diagnosis not present

## 2022-11-25 DIAGNOSIS — Z955 Presence of coronary angioplasty implant and graft: Secondary | ICD-10-CM | POA: Diagnosis not present

## 2022-11-25 DIAGNOSIS — I2510 Atherosclerotic heart disease of native coronary artery without angina pectoris: Secondary | ICD-10-CM | POA: Diagnosis not present

## 2022-11-25 DIAGNOSIS — Z7984 Long term (current) use of oral hypoglycemic drugs: Secondary | ICD-10-CM | POA: Diagnosis not present

## 2022-11-25 DIAGNOSIS — R9431 Abnormal electrocardiogram [ECG] [EKG]: Secondary | ICD-10-CM | POA: Diagnosis not present

## 2022-11-25 DIAGNOSIS — F32A Depression, unspecified: Secondary | ICD-10-CM | POA: Diagnosis not present

## 2022-11-25 DIAGNOSIS — R001 Bradycardia, unspecified: Secondary | ICD-10-CM | POA: Diagnosis not present

## 2022-11-25 DIAGNOSIS — G9341 Metabolic encephalopathy: Secondary | ICD-10-CM | POA: Diagnosis not present

## 2022-11-25 DIAGNOSIS — M1990 Unspecified osteoarthritis, unspecified site: Secondary | ICD-10-CM | POA: Diagnosis not present

## 2022-11-26 DIAGNOSIS — R001 Bradycardia, unspecified: Secondary | ICD-10-CM | POA: Diagnosis not present

## 2022-11-26 DIAGNOSIS — R079 Chest pain, unspecified: Secondary | ICD-10-CM | POA: Diagnosis not present

## 2022-11-27 DIAGNOSIS — R079 Chest pain, unspecified: Secondary | ICD-10-CM | POA: Diagnosis not present

## 2022-12-03 ENCOUNTER — Encounter: Payer: Self-pay | Admitting: Cardiology

## 2022-12-09 ENCOUNTER — Encounter: Payer: Self-pay | Admitting: Urology

## 2022-12-15 ENCOUNTER — Encounter: Payer: Self-pay | Admitting: Urology

## 2022-12-15 ENCOUNTER — Ambulatory Visit (INDEPENDENT_AMBULATORY_CARE_PROVIDER_SITE_OTHER): Payer: Medicare Other | Admitting: Urology

## 2022-12-15 VITALS — BP 146/78 | HR 74

## 2022-12-15 DIAGNOSIS — N3941 Urge incontinence: Secondary | ICD-10-CM | POA: Diagnosis not present

## 2022-12-15 DIAGNOSIS — R350 Frequency of micturition: Secondary | ICD-10-CM | POA: Diagnosis not present

## 2022-12-15 DIAGNOSIS — Z85528 Personal history of other malignant neoplasm of kidney: Secondary | ICD-10-CM

## 2022-12-15 DIAGNOSIS — Z87440 Personal history of urinary (tract) infections: Secondary | ICD-10-CM | POA: Diagnosis not present

## 2022-12-15 DIAGNOSIS — R8290 Unspecified abnormal findings in urine: Secondary | ICD-10-CM | POA: Diagnosis not present

## 2022-12-15 NOTE — Progress Notes (Signed)
Assessment: 1. Urge incontinence   2. History of UTI   3. History of renal cell cancer   4. Abnormal urine findings     Plan: I personally reviewed the urodynamic study from 12/05/2022 with results as noted below.  I discussed these results with the patient in detail today.  Unfortunately, the urodynamic study does not appear to correlate with her symptoms. 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 She has failed medical therapy with Vesicare, Myrbetriq, Gemtesa, and trospium. I would like to discuss her case with my partner, Dr. Sherron Monday and refer her to him for a second opinion. Urine culture sent today - will call with results  Chief Complaint:  Chief Complaint  Patient presents with   Urinary Incontinence    History of Present Illness:  Cynthia Kelly is a 82 y.o. female who is seen for further evaluation of urge incontinence, incomplete bladder emptying, and history of right flank pain.   She was seen in the emergency room at [facility] in January 2024 for evaluation of acute mental status changes and possible UTI.  She was actually diagnosed with right lobar pneumonia.  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  Urinalysis showed 4 WBCs and few bacteria.    She has a history of UTIs. Urine culture from 9/23 grew 25-50 K Enterococcus. Urine culture from 07/07/2022 grew 10-25K Enterococcus. Urine culture from 07/18/2022 grew <10K mixed flora.  She has symptoms of urinary frequency, urgency, nocturia 1-2 times, and urge incontinence.  She was using 7-8 pads per day.  She has been seen by urology in [place].  She had been on Vesicare 10 mg daily for approximately 2 months.  She had not seen improvement in her incontinence with the medication.  No dysuria or gross hematuria.  She is status post a left nephrectomy in 1986 for  renal cell carcinoma.  This was performed by Dr. Leroy Libman in [place].  PVR previously was 422 ml. Her solifenacin was discontinued and she was given a trial of Myrbetriq. She did not notice any improvement in her incontinence, frequency and urgency with Myrbetriq.  No dysuria or gross hematuria.  She continued to require pad usage daily. She was given a trial of Gemtesa 75 mg daily at her visit in May 2024.  She was also given information on PTNS. Unfortunately, the Gemtesa did not improve her symptoms.  She was changed to trospium 20 mg twice daily. She discontinued the trospium as it was not effective in controlling her symptoms.  She continued to have frequency, urgency, and urge incontinence.  She was using 7-8 pads/day.  He underwent evaluation with urodynamics on 12/05/2022.  The patient voided to completion prior to the study.  She was found to have a maximum capacity of 500 mL with fairly normal sensation.  No evidence of bladder instability was noted.  She was found to have stress incontinence with a Valsalva leak point pressures of 87 cm and 113 cm of water.  She was able to void voluntarily with a max detrusor pressure of 7 cm of water.  The catheter fell out during the voiding phase.  She had a postvoid residual of 253 mL.  Slight increased EMG activity was noted during voiding.  She returns today to discuss the urodynamic results.  She continues to have symptoms of frequency, urgency, and urge  incontinence.  She does not notice symptoms of stress incontinence.  She continues to require multiple pads throughout the day and night.  No dysuria or gross hematuria.  She reports cloudy urine.  Portions of the above documentation were copied from a prior visit for review purposes only.   Past Medical History:  Past Medical History:  Diagnosis Date   CAD (coronary artery disease)    stent to LAD in July '21   Diabetes mellitus without complication (HCC)    Paroxysmal atrial fibrillation  (HCC)    RLS (restless legs syndrome)     Past Surgical History:  Past Surgical History:  Procedure Laterality Date   ABDOMINAL HYSTERECTOMY     APPENDECTOMY     CHOLECYSTECTOMY      Allergies:  Allergies  Allergen Reactions   Aspirin Hives   Morphine Shortness Of Breath    Other reaction(s): Confusion (intolerance), Other (See Comments) disorientedddrives me crazy    Metformin      Abdominal cramping-intense   Morphine And Codeine    Betadine [Povidone Iodine] Rash   Latex Rash   Povidone-Iodine Rash   Tape Rash and Other (See Comments)    adhesive Band aids    Family History:  No family history on file.  Social History:  Social History   Tobacco Use   Smoking status: Never   Smokeless tobacco: Never  Substance Use Topics   Alcohol use: Not Currently   Drug use: Not Currently    ROS: Constitutional:  Negative for fever, chills, weight loss CV: Negative for chest pain, previous MI, hypertension Respiratory:  Negative for shortness of breath, wheezing, sleep apnea, frequent cough GI:  Negative for nausea, vomiting, bloody stool, GERD  Physical exam: BP (!) 146/78   Pulse 74  GENERAL APPEARANCE:  Well appearing, well developed, well nourished, NAD HEENT:  Atraumatic, normocephalic, oropharynx clear NECK:  Supple without lymphadenopathy or thyromegaly ABDOMEN:  Soft, non-tender, no masses EXTREMITIES:  Moves all extremities well, without clubbing, cyanosis, or edema NEUROLOGIC:  Alert and oriented x 3, normal gait, CN II-XII grossly intact MENTAL STATUS:  appropriate BACK:  Non-tender to palpation, No CVAT SKIN:  Warm, dry, and intact  Results: U/A: >30 WBC, 0-2 RBC, many bacteria, nitrite -

## 2022-12-19 ENCOUNTER — Encounter: Payer: Self-pay | Admitting: Urology

## 2022-12-19 MED ORDER — CEFDINIR 300 MG PO CAPS: 300.0000 mg | ORAL_CAPSULE | Freq: Two times a day (BID) | ORAL | 0 refills | Status: AC

## 2023-01-08 DIAGNOSIS — E1159 Type 2 diabetes mellitus with other circulatory complications: Secondary | ICD-10-CM | POA: Diagnosis not present

## 2023-01-08 DIAGNOSIS — I10 Essential (primary) hypertension: Secondary | ICD-10-CM | POA: Diagnosis not present

## 2023-01-08 DIAGNOSIS — I2089 Other forms of angina pectoris: Secondary | ICD-10-CM | POA: Diagnosis not present

## 2023-01-08 DIAGNOSIS — I480 Paroxysmal atrial fibrillation: Secondary | ICD-10-CM | POA: Diagnosis not present

## 2023-01-08 DIAGNOSIS — G2581 Restless legs syndrome: Secondary | ICD-10-CM | POA: Diagnosis not present

## 2023-01-08 DIAGNOSIS — R609 Edema, unspecified: Secondary | ICD-10-CM | POA: Diagnosis not present

## 2023-01-08 DIAGNOSIS — E785 Hyperlipidemia, unspecified: Secondary | ICD-10-CM | POA: Diagnosis not present

## 2023-01-08 DIAGNOSIS — C642 Malignant neoplasm of left kidney, except renal pelvis: Secondary | ICD-10-CM | POA: Diagnosis not present

## 2023-01-08 DIAGNOSIS — I2510 Atherosclerotic heart disease of native coronary artery without angina pectoris: Secondary | ICD-10-CM | POA: Diagnosis not present

## 2023-01-08 DIAGNOSIS — R5383 Other fatigue: Secondary | ICD-10-CM | POA: Diagnosis not present

## 2023-01-22 DIAGNOSIS — M159 Polyosteoarthritis, unspecified: Secondary | ICD-10-CM | POA: Diagnosis not present

## 2023-01-22 DIAGNOSIS — R609 Edema, unspecified: Secondary | ICD-10-CM | POA: Diagnosis not present

## 2023-01-22 DIAGNOSIS — G2581 Restless legs syndrome: Secondary | ICD-10-CM | POA: Diagnosis not present

## 2023-01-22 DIAGNOSIS — M810 Age-related osteoporosis without current pathological fracture: Secondary | ICD-10-CM | POA: Diagnosis not present

## 2023-01-22 DIAGNOSIS — I2089 Other forms of angina pectoris: Secondary | ICD-10-CM | POA: Diagnosis not present

## 2023-01-22 DIAGNOSIS — I480 Paroxysmal atrial fibrillation: Secondary | ICD-10-CM | POA: Diagnosis not present

## 2023-01-22 DIAGNOSIS — E1159 Type 2 diabetes mellitus with other circulatory complications: Secondary | ICD-10-CM | POA: Diagnosis not present

## 2023-01-22 DIAGNOSIS — I10 Essential (primary) hypertension: Secondary | ICD-10-CM | POA: Diagnosis not present

## 2023-01-22 DIAGNOSIS — I2510 Atherosclerotic heart disease of native coronary artery without angina pectoris: Secondary | ICD-10-CM | POA: Diagnosis not present

## 2023-01-22 DIAGNOSIS — E785 Hyperlipidemia, unspecified: Secondary | ICD-10-CM | POA: Diagnosis not present

## 2023-01-22 DIAGNOSIS — C642 Malignant neoplasm of left kidney, except renal pelvis: Secondary | ICD-10-CM | POA: Diagnosis not present

## 2023-01-30 DIAGNOSIS — I2089 Other forms of angina pectoris: Secondary | ICD-10-CM | POA: Diagnosis not present

## 2023-01-30 DIAGNOSIS — E1159 Type 2 diabetes mellitus with other circulatory complications: Secondary | ICD-10-CM | POA: Diagnosis not present

## 2023-01-30 DIAGNOSIS — E785 Hyperlipidemia, unspecified: Secondary | ICD-10-CM | POA: Diagnosis not present

## 2023-01-30 DIAGNOSIS — I480 Paroxysmal atrial fibrillation: Secondary | ICD-10-CM | POA: Diagnosis not present

## 2023-01-30 DIAGNOSIS — C642 Malignant neoplasm of left kidney, except renal pelvis: Secondary | ICD-10-CM | POA: Diagnosis not present

## 2023-01-30 DIAGNOSIS — I2510 Atherosclerotic heart disease of native coronary artery without angina pectoris: Secondary | ICD-10-CM | POA: Diagnosis not present

## 2023-01-30 DIAGNOSIS — R609 Edema, unspecified: Secondary | ICD-10-CM | POA: Diagnosis not present

## 2023-01-30 DIAGNOSIS — I10 Essential (primary) hypertension: Secondary | ICD-10-CM | POA: Diagnosis not present

## 2023-01-30 DIAGNOSIS — G2581 Restless legs syndrome: Secondary | ICD-10-CM | POA: Diagnosis not present

## 2023-01-30 DIAGNOSIS — M159 Polyosteoarthritis, unspecified: Secondary | ICD-10-CM | POA: Diagnosis not present

## 2023-02-11 DIAGNOSIS — R350 Frequency of micturition: Secondary | ICD-10-CM | POA: Diagnosis not present

## 2023-02-13 DIAGNOSIS — M8580 Other specified disorders of bone density and structure, unspecified site: Secondary | ICD-10-CM | POA: Diagnosis not present

## 2023-02-13 DIAGNOSIS — M85852 Other specified disorders of bone density and structure, left thigh: Secondary | ICD-10-CM | POA: Diagnosis not present

## 2023-02-16 DIAGNOSIS — G2581 Restless legs syndrome: Secondary | ICD-10-CM | POA: Diagnosis not present

## 2023-02-16 DIAGNOSIS — E785 Hyperlipidemia, unspecified: Secondary | ICD-10-CM | POA: Diagnosis not present

## 2023-02-16 DIAGNOSIS — M159 Polyosteoarthritis, unspecified: Secondary | ICD-10-CM | POA: Diagnosis not present

## 2023-02-16 DIAGNOSIS — I2510 Atherosclerotic heart disease of native coronary artery without angina pectoris: Secondary | ICD-10-CM | POA: Diagnosis not present

## 2023-02-16 DIAGNOSIS — I2089 Other forms of angina pectoris: Secondary | ICD-10-CM | POA: Diagnosis not present

## 2023-02-16 DIAGNOSIS — E1159 Type 2 diabetes mellitus with other circulatory complications: Secondary | ICD-10-CM | POA: Diagnosis not present

## 2023-02-16 DIAGNOSIS — I10 Essential (primary) hypertension: Secondary | ICD-10-CM | POA: Diagnosis not present

## 2023-02-16 DIAGNOSIS — C642 Malignant neoplasm of left kidney, except renal pelvis: Secondary | ICD-10-CM | POA: Diagnosis not present

## 2023-02-16 DIAGNOSIS — M8580 Other specified disorders of bone density and structure, unspecified site: Secondary | ICD-10-CM | POA: Diagnosis not present

## 2023-02-16 DIAGNOSIS — R609 Edema, unspecified: Secondary | ICD-10-CM | POA: Diagnosis not present

## 2023-02-16 DIAGNOSIS — I480 Paroxysmal atrial fibrillation: Secondary | ICD-10-CM | POA: Diagnosis not present

## 2023-07-13 ENCOUNTER — Ambulatory Visit (HOSPITAL_BASED_OUTPATIENT_CLINIC_OR_DEPARTMENT_OTHER): Admission: EM | Admit: 2023-07-13 | Discharge: 2023-07-13 | Disposition: A | Discharge status: Home / Self Care

## 2023-07-13 ENCOUNTER — Encounter (HOSPITAL_BASED_OUTPATIENT_CLINIC_OR_DEPARTMENT_OTHER): Payer: Self-pay | Admitting: Emergency Medicine

## 2023-07-13 DIAGNOSIS — B372 Candidiasis of skin and nail: Secondary | ICD-10-CM | POA: Diagnosis not present

## 2023-07-13 DIAGNOSIS — L299 Pruritus, unspecified: Secondary | ICD-10-CM | POA: Diagnosis not present

## 2023-07-13 MED ORDER — NYSTATIN 100000 UNIT/GM EX POWD: 1.0000 | Freq: Two times a day (BID) | CUTANEOUS | 0 refills | Status: DC | PRN

## 2023-07-13 MED ORDER — NYSTATIN 100000 UNIT/GM EX CREA: TOPICAL_CREAM | CUTANEOUS | 2 refills | Status: DC

## 2023-07-13 MED ORDER — FLUCONAZOLE 100 MG PO TABS: 100.0000 mg | ORAL_TABLET | Freq: Every day | ORAL | 0 refills | Status: AC

## 2023-07-13 NOTE — ED Triage Notes (Signed)
 Pt reports she has a rash under her breast and under her folds of her abdomen. Pt has tried neosporin and medicated powders.

## 2023-07-13 NOTE — Discharge Instructions (Addendum)
 Rash is yeast of the skin.  Clean the areas gently with warm soapy water, rinse off the soap, pat dry and initially while the rash is so tender and moist, use the nystatin powder 2 or 3 times a day.  Once the rash begins to improve, may stop the powder and use nystatin cream a thin coating up to 4 times a day.  Ongoing you may want to use the powder during the day and the cream prior to sleep.  Will also take fluconazole, 100 mg, daily for 10 days.  The fluconazole will help get the rash gone faster.  This is a yeast rash and it does relate to diabetes.  It may be partially due to use of [place].  If the rash persist see your primary care doctor you may need a change in your diabetes medication.

## 2023-07-13 NOTE — ED Provider Notes (Signed)
 [facility]    CSN: [id number] Arrival date & time: 07/13/23  1143      History   Chief Complaint No chief complaint on file.   HPI Cynthia Kelly is a 83 y.o. female.   Pt reports she has a rash under her breast and under her folds of her abdomen. Pt has tried neosporin and medicated powders.  She has had this rash for over 3 weeks.  She does have diabetes and is on [place] for the diabetes.     Past Medical History:  Diagnosis Date   CAD (coronary artery disease)    stent to LAD in July '21   Diabetes mellitus without complication (HCC)    Paroxysmal atrial fibrillation (HCC)    RLS (restless legs syndrome)     Patient Active Problem List   Diagnosis Date Noted   History of UTI 08/19/2022   History of renal cell cancer 08/19/2022   Urge incontinence 08/19/2022   COVID-19 virus infection 06/05/2020   SOB (shortness of breath) 06/05/2020   Generalized weakness 06/05/2020   Diabetes mellitus without complication (HCC)    CAD (coronary artery disease)    Paroxysmal atrial fibrillation (HCC)     Past Surgical History:  Procedure Laterality Date   ABDOMINAL HYSTERECTOMY     APPENDECTOMY     CHOLECYSTECTOMY      OB History   No obstetric history on file.      Home Medications    Prior to Admission medications   Medication Sig Start Date End Date Taking? Authorizing Provider  aspirin 81 MG chewable tablet Chew by mouth. 11/12/19  Yes [provider]  dapagliflozin propanediol (FARXIGA) 10 MG TABS tablet Take 10 mg by mouth daily. 03/15/20  Yes [provider]  ELIQUIS 5 MG TABS tablet Take 5 mg by mouth 2 (two) times daily. 11/14/22  Yes [provider]  fluconazole (DIFLUCAN) 100 MG tablet Take 1 tablet (100 mg total) by mouth daily for 10 days. 07/13/23 07/23/23 Yes Prescilla Sours, FNP  gemfibrozil (LOPID) 600 MG tablet Take 600 mg by mouth 2 (two) times daily. 11/27/22  Yes [provider]  glipiZIDE (GLUCOTROL XL) 5  MG 24 hr tablet Take 10 mg by mouth daily. 03/23/20  Yes [provider]  metoprolol succinate (TOPROL-XL) 25 MG 24 hr tablet Take 25 mg by mouth daily. 09/16/22  Yes [provider]  nystatin (MYCOSTATIN/NYSTOP) powder Apply 1 Application topically 2 (two) times daily as needed (rash in groin folds). 07/13/23  Yes Prescilla Sours, FNP  nystatin cream (MYCOSTATIN) Apply a very thin amount to any of the red rashes in the skin folds up to 4 times daily. 07/13/23  Yes Prescilla Sours, FNP  pramipexole (MIRAPEX) 1 MG tablet Take 0.5-1 mg by mouth See admin instructions. 0.5 mg in the am. 1mg  hs 10/06/14  Yes [provider]  spironolactone (ALDACTONE) 25 MG tablet Take 12.5 mg by mouth daily. 04/29/20  Yes [provider]  ACCU-CHEK AVIVA PLUS test strip 1 each by Other route Once Daily. 12/14/19   [provider]  Blood Glucose Monitoring Suppl (TGT BLOOD GLUCOSE MONITORING) w/Device KIT Use as directed to check blood sugars (dx code E11.9) 01/06/18   [provider]  furosemide (LASIX) 40 MG tablet Take 40 mg by mouth 2 (two) times daily.    [provider]  Lancets (ACCU-CHEK SOFT TOUCH) lancets 1 each in the morning and at bedtime. 02/22/19   [provider]  niacin (VITAMIN B3) 500 MG tablet Take 500 mg by mouth 2 (two) times daily with a meal.    [provider]  torsemide (DEMADEX) 5 MG tablet Take 5 mg by mouth daily. 2 tablets daily    [provider]    Family History History reviewed. No pertinent family history.  Social History Social History   Tobacco Use   Smoking status: Never   Smokeless tobacco: Never  Substance Use Topics   Alcohol use: Not Currently   Drug use: Not Currently     Allergies   Aspirin, Morphine, Metformin, Morphine and codeine, Betadine [povidone iodine], Latex, Povidone-iodine, and Tape   Review of Systems Review of Systems  Constitutional:  Negative for chills and fever.   HENT:  Negative for ear pain and sore throat.   Eyes:  Negative for pain and visual disturbance.  Respiratory:  Negative for cough and shortness of breath.   Cardiovascular:  Negative for chest pain and palpitations.  Gastrointestinal:  Negative for abdominal pain, constipation, diarrhea, nausea and vomiting.  Genitourinary:  Negative for dysuria and hematuria.  Musculoskeletal:  Negative for arthralgias and back pain.  Skin:  Positive for rash (red rash under breast and skin folds of groin.). Negative for color change.  Neurological:  Negative for seizures and syncope.  All other systems reviewed and are negative.    Physical Exam Triage Vital Signs ED Triage Vitals  Encounter Vitals Group     BP 07/13/23 1300 (!) 147/82     Systolic BP Percentile --      Diastolic BP Percentile --      Pulse Rate 07/13/23 1300 69     Resp 07/13/23 1300 20     Temp 07/13/23 1300 97.7 F (36.5 C)     Temp Source 07/13/23 1300 Oral     SpO2 07/13/23 1300 95 %     Weight --      Height --      Head Circumference --      Peak Flow --      Pain Score 07/13/23 1257 0     Pain Loc --      Pain Education --      Exclude from Growth Chart --    No data found.  Updated Vital Signs BP (!) 147/82 (BP Location: Left Arm)   Pulse 69   Temp 97.7 F (36.5 C) (Oral)   Resp 20   SpO2 95%   Visual Acuity Right Eye Distance:   Left Eye Distance:   Bilateral Distance:    Right Eye Near:   Left Eye Near:    Bilateral Near:     Physical Exam Vitals and nursing note reviewed.  Constitutional:      General: She is not in acute distress.    Appearance: She is well-developed. She is not ill-appearing or toxic-appearing.  HENT:     Head: Normocephalic and atraumatic.     Right Ear: Hearing, tympanic membrane, ear canal and external ear normal.     Left Ear: Hearing, tympanic membrane, ear canal and external ear normal.     Nose: No congestion or rhinorrhea.     Right Sinus: No maxillary sinus  tenderness or frontal sinus tenderness.     Left Sinus: No maxillary sinus tenderness or frontal sinus tenderness.     Mouth/Throat:     Lips: Pink.     Mouth: Mucous membranes are moist.     Pharynx: Uvula midline. No oropharyngeal exudate or  posterior oropharyngeal erythema.     Tonsils: No tonsillar exudate.  Eyes:     Conjunctiva/sclera: Conjunctivae normal.     Pupils: Pupils are equal, round, and reactive to light.  Cardiovascular:     Rate and Rhythm: Normal rate and regular rhythm.     Heart sounds: S1 normal and S2 normal. No murmur heard. Pulmonary:     Effort: Pulmonary effort is normal. No respiratory distress.     Breath sounds: Normal breath sounds. No decreased breath sounds, wheezing, rhonchi or rales.  Abdominal:     General: Bowel sounds are normal.     Palpations: Abdomen is soft.     Tenderness: There is no abdominal tenderness.  Musculoskeletal:        General: No swelling.     Cervical back: Neck supple.  Lymphadenopathy:     Head:     Right side of head: No submental, submandibular, tonsillar, preauricular or posterior auricular adenopathy.     Left side of head: No submental, submandibular, tonsillar, preauricular or posterior auricular adenopathy.     Cervical: No cervical adenopathy.     Right cervical: No superficial cervical adenopathy.    Left cervical: No superficial cervical adenopathy.  Skin:    General: Skin is warm and dry.     Capillary Refill: Capillary refill takes less than 2 seconds.     Findings: Rash (Erythematous, moist rash under both breast and in both groin folds.  The rash is 6 to 8 inches long and 4 to 5 inches wide in each groin groin fold.  The rash is 4 inches wide and 2 to 3 inches deep under each breast.) present.  Neurological:     Mental Status: She is alert and oriented to person, place, and time.  Psychiatric:        Mood and Affect: Mood normal.      UC Treatments / Results  Labs (all labs ordered are listed, but  only abnormal results are displayed) Labs Reviewed - No data to display  EKG   Radiology No results found.  Procedures Procedures (including critical care time)  Medications Ordered in UC Medications - No data to display  Initial Impression / Assessment and Plan / UC Course  I have reviewed the triage vital signs and the nursing notes.  Pertinent labs & imaging results that were available during my care of the patient were reviewed by me and considered in my medical decision making (see chart for details).     Rash is a yeast rash.  See directions below.  Will take fluconazole, 100 mg daily for 10 days.  Will use either nystatin powder or nystatin cream topically.  If symptoms do not improve, worsen or new symptoms occur.  This rash is due to diabetes and could be related to use of [place].  If rash persist needs to see family doctor and may need a change in your diabetes medication. Final Clinical Impressions(s) / UC Diagnoses   Final diagnoses:  Candidal intertrigo  Itching     Discharge Instructions      Rash is yeast of the skin.  Clean the areas gently with warm soapy water, rinse off the soap, pat dry and initially while the rash is so tender and moist, use the nystatin powder 2 or 3 times a day.  Once the rash begins to improve, may stop the powder and use nystatin cream a thin coating up to 4 times a day.  Ongoing you may want to  use the powder during the day and the cream prior to sleep.  Will also take fluconazole, 100 mg, daily for 10 days.  The fluconazole will help get the rash gone faster.  This is a yeast rash and it does relate to diabetes.  It may be partially due to use of [place].  If the rash persist see your primary care doctor you may need a change in your diabetes medication.     ED Prescriptions     Medication Sig Dispense Auth. Provider   fluconazole (DIFLUCAN) 100 MG tablet Take 1 tablet (100 mg total) by mouth daily for 10 days. 10 tablet  Prescilla Sours, FNP   nystatin cream (MYCOSTATIN) Apply a very thin amount to any of the red rashes in the skin folds up to 4 times daily. 30 g Prescilla Sours, FNP   nystatin (MYCOSTATIN/NYSTOP) powder Apply 1 Application topically 2 (two) times daily as needed (rash in groin folds). 15 g Prescilla Sours, FNP      PDMP not reviewed this encounter.   Prescilla Sours, FNP 07/13/23 1345

## 2023-07-21 DIAGNOSIS — B372 Candidiasis of skin and nail: Secondary | ICD-10-CM | POA: Diagnosis not present

## 2023-07-21 DIAGNOSIS — Z0000 Encounter for general adult medical examination without abnormal findings: Secondary | ICD-10-CM | POA: Diagnosis not present

## 2023-07-21 DIAGNOSIS — M25559 Pain in unspecified hip: Secondary | ICD-10-CM | POA: Diagnosis not present

## 2023-07-21 DIAGNOSIS — G4700 Insomnia, unspecified: Secondary | ICD-10-CM | POA: Diagnosis not present

## 2023-07-21 DIAGNOSIS — R0609 Other forms of dyspnea: Secondary | ICD-10-CM | POA: Diagnosis not present

## 2023-10-07 DIAGNOSIS — M1712 Unilateral primary osteoarthritis, left knee: Secondary | ICD-10-CM | POA: Diagnosis not present

## 2023-10-07 DIAGNOSIS — I480 Paroxysmal atrial fibrillation: Secondary | ICD-10-CM | POA: Diagnosis not present

## 2023-10-07 DIAGNOSIS — G4700 Insomnia, unspecified: Secondary | ICD-10-CM | POA: Diagnosis not present

## 2023-10-07 DIAGNOSIS — G47419 Narcolepsy without cataplexy: Secondary | ICD-10-CM | POA: Diagnosis not present

## 2023-10-07 DIAGNOSIS — E1165 Type 2 diabetes mellitus with hyperglycemia: Secondary | ICD-10-CM | POA: Diagnosis not present

## 2023-10-07 DIAGNOSIS — B372 Candidiasis of skin and nail: Secondary | ICD-10-CM | POA: Diagnosis not present

## 2023-11-20 DIAGNOSIS — M549 Dorsalgia, unspecified: Secondary | ICD-10-CM | POA: Diagnosis not present

## 2023-12-15 DIAGNOSIS — E1169 Type 2 diabetes mellitus with other specified complication: Secondary | ICD-10-CM | POA: Diagnosis not present

## 2023-12-15 DIAGNOSIS — R0602 Shortness of breath: Secondary | ICD-10-CM | POA: Diagnosis not present

## 2023-12-15 DIAGNOSIS — I110 Hypertensive heart disease with heart failure: Secondary | ICD-10-CM | POA: Diagnosis not present

## 2023-12-15 DIAGNOSIS — R9431 Abnormal electrocardiogram [ECG] [EKG]: Secondary | ICD-10-CM | POA: Diagnosis not present

## 2023-12-15 DIAGNOSIS — Z7984 Long term (current) use of oral hypoglycemic drugs: Secondary | ICD-10-CM | POA: Diagnosis not present

## 2023-12-15 DIAGNOSIS — M7989 Other specified soft tissue disorders: Secondary | ICD-10-CM | POA: Diagnosis not present

## 2023-12-15 DIAGNOSIS — I480 Paroxysmal atrial fibrillation: Secondary | ICD-10-CM | POA: Diagnosis not present

## 2023-12-15 DIAGNOSIS — I700 Atherosclerosis of aorta: Secondary | ICD-10-CM | POA: Diagnosis not present

## 2023-12-15 DIAGNOSIS — Z7901 Long term (current) use of anticoagulants: Secondary | ICD-10-CM | POA: Diagnosis not present

## 2023-12-15 DIAGNOSIS — R600 Localized edema: Secondary | ICD-10-CM | POA: Diagnosis not present

## 2023-12-15 DIAGNOSIS — R079 Chest pain, unspecified: Secondary | ICD-10-CM | POA: Diagnosis not present

## 2023-12-15 DIAGNOSIS — R0789 Other chest pain: Secondary | ICD-10-CM | POA: Diagnosis not present

## 2023-12-15 DIAGNOSIS — I361 Nonrheumatic tricuspid (valve) insufficiency: Secondary | ICD-10-CM | POA: Diagnosis not present

## 2023-12-15 DIAGNOSIS — I509 Heart failure, unspecified: Secondary | ICD-10-CM | POA: Diagnosis not present

## 2023-12-15 DIAGNOSIS — R531 Weakness: Secondary | ICD-10-CM | POA: Diagnosis not present

## 2023-12-15 DIAGNOSIS — R601 Generalized edema: Secondary | ICD-10-CM | POA: Diagnosis not present

## 2023-12-15 DIAGNOSIS — E785 Hyperlipidemia, unspecified: Secondary | ICD-10-CM | POA: Diagnosis not present

## 2023-12-15 DIAGNOSIS — R071 Chest pain on breathing: Secondary | ICD-10-CM | POA: Diagnosis not present

## 2023-12-16 DIAGNOSIS — R531 Weakness: Secondary | ICD-10-CM | POA: Diagnosis not present

## 2023-12-16 DIAGNOSIS — R600 Localized edema: Secondary | ICD-10-CM | POA: Diagnosis not present

## 2023-12-16 DIAGNOSIS — I2089 Other forms of angina pectoris: Secondary | ICD-10-CM | POA: Diagnosis not present

## 2023-12-16 DIAGNOSIS — R0602 Shortness of breath: Secondary | ICD-10-CM | POA: Diagnosis not present

## 2023-12-16 DIAGNOSIS — R9431 Abnormal electrocardiogram [ECG] [EKG]: Secondary | ICD-10-CM | POA: Diagnosis not present

## 2023-12-16 DIAGNOSIS — R0789 Other chest pain: Secondary | ICD-10-CM | POA: Diagnosis not present

## 2023-12-16 DIAGNOSIS — I361 Nonrheumatic tricuspid (valve) insufficiency: Secondary | ICD-10-CM | POA: Diagnosis not present

## 2023-12-17 DIAGNOSIS — R079 Chest pain, unspecified: Secondary | ICD-10-CM | POA: Diagnosis not present

## 2023-12-24 DIAGNOSIS — E785 Hyperlipidemia, unspecified: Secondary | ICD-10-CM | POA: Diagnosis not present

## 2023-12-24 DIAGNOSIS — E1165 Type 2 diabetes mellitus with hyperglycemia: Secondary | ICD-10-CM | POA: Diagnosis not present

## 2023-12-25 DIAGNOSIS — R079 Chest pain, unspecified: Secondary | ICD-10-CM | POA: Diagnosis not present

## 2023-12-30 DIAGNOSIS — R079 Chest pain, unspecified: Secondary | ICD-10-CM | POA: Diagnosis not present

## 2024-01-01 DIAGNOSIS — E1165 Type 2 diabetes mellitus with hyperglycemia: Secondary | ICD-10-CM | POA: Diagnosis not present

## 2024-01-28 DIAGNOSIS — I10 Essential (primary) hypertension: Secondary | ICD-10-CM | POA: Diagnosis not present

## 2024-01-28 DIAGNOSIS — E782 Mixed hyperlipidemia: Secondary | ICD-10-CM | POA: Diagnosis not present

## 2024-01-28 DIAGNOSIS — Z789 Other specified health status: Secondary | ICD-10-CM | POA: Diagnosis not present

## 2024-01-28 DIAGNOSIS — E1169 Type 2 diabetes mellitus with other specified complication: Secondary | ICD-10-CM | POA: Diagnosis not present

## 2024-01-28 DIAGNOSIS — E7800 Pure hypercholesterolemia, unspecified: Secondary | ICD-10-CM | POA: Diagnosis not present

## 2024-02-11 DIAGNOSIS — M549 Dorsalgia, unspecified: Secondary | ICD-10-CM | POA: Diagnosis not present

## 2024-02-29 DIAGNOSIS — Z789 Other specified health status: Secondary | ICD-10-CM | POA: Diagnosis not present

## 2024-02-29 DIAGNOSIS — R600 Localized edema: Secondary | ICD-10-CM | POA: Diagnosis not present

## 2024-02-29 DIAGNOSIS — Z955 Presence of coronary angioplasty implant and graft: Secondary | ICD-10-CM | POA: Diagnosis not present

## 2024-02-29 DIAGNOSIS — Z7901 Long term (current) use of anticoagulants: Secondary | ICD-10-CM | POA: Diagnosis not present

## 2024-02-29 DIAGNOSIS — E1169 Type 2 diabetes mellitus with other specified complication: Secondary | ICD-10-CM | POA: Diagnosis not present

## 2024-02-29 DIAGNOSIS — I480 Paroxysmal atrial fibrillation: Secondary | ICD-10-CM | POA: Diagnosis not present

## 2024-02-29 DIAGNOSIS — I10 Essential (primary) hypertension: Secondary | ICD-10-CM | POA: Diagnosis not present

## 2024-02-29 DIAGNOSIS — I2510 Atherosclerotic heart disease of native coronary artery without angina pectoris: Secondary | ICD-10-CM | POA: Diagnosis not present

## 2024-02-29 DIAGNOSIS — E782 Mixed hyperlipidemia: Secondary | ICD-10-CM | POA: Diagnosis not present

## 2024-02-29 DIAGNOSIS — E7800 Pure hypercholesterolemia, unspecified: Secondary | ICD-10-CM | POA: Diagnosis not present

## 2024-04-25 ENCOUNTER — Other Ambulatory Visit: Payer: Self-pay | Admitting: Oncology

## 2024-04-25 DIAGNOSIS — R718 Other abnormality of red blood cells: Secondary | ICD-10-CM

## 2024-04-25 NOTE — Progress Notes (Signed)
 " [facility] [address] [place],  [place]  [address] [phone]  Clinic Day:  04/26/2024  Referring physician: Gerome Brunet, DO   HISTORY OF PRESENT ILLNESS:  The patient is a 83 y.o. female who I was asked to consult upon for polycythemia.  Recent labs in November 2025 showed an elevated hemoglobin of 17, an elevated hematocrit of 49, and an elevated red blood count of 6.11 million.   According to the patient, her elevated hemoglobin was found incidentally per routine blood work.  She denies ever being told before of having elevated red blood cell indices.  She denies having a history of dehydration, which can cause a pseudopolycythemia.  She also denies having undergone a previous splenectomy, which can lead to chronically elevated red cell indices.  She also denies having any B symptoms which concern her for her polycythemia being due to an underlying hematologic malignancy.  The patient just started taking insulin  and a cholesterol medicine.  However, she does not believe she is on any medications which are known to cause a secondary polycythemia.  To her knowledge, there is no family history of polycythemia or other hematologic disorders.  PAST MEDICAL HISTORY:   Past Medical History:  Diagnosis Date   Arthritis    CAD (coronary artery disease)    stent to LAD in July '21   Chronic total occlusion of coronary artery    Diabetes mellitus without complication (HCC)    Gait disturbance    Heart murmur    Hypertension    Mild basilar atelectasis of both lungs    Non-compliance with treatment    Paroxysmal atrial fibrillation (HCC)    Renal carcinoma (HCC)    RLS (restless legs syndrome)    Statin intolerance    Status post percutaneous coronary intervention (PCI)    Urinary incontinence     PAST SURGICAL HISTORY:   Past Surgical History:  Procedure Laterality Date   ABDOMINAL HYSTERECTOMY     APPENDECTOMY     CHOLECYSTECTOMY      CORONARY ANGIOPLASTY WITH STENT PLACEMENT     NEPHRECTOMY Left     CURRENT MEDICATIONS:   Current Outpatient Medications  Medication Sig Dispense Refill   acetaminophen -codeine (TYLENOL  #3) 300-30 MG tablet Take 1 tablet by mouth every 8 (eight) hours as needed for moderate pain (pain score 4-6).     aspirin  81 MG chewable tablet Chew by mouth.     dapagliflozin propanediol (FARXIGA) 10 MG TABS tablet Take 10 mg by mouth daily.     ELIQUIS  5 MG TABS tablet Take 5 mg by mouth 2 (two) times daily.     furosemide  (LASIX ) 40 MG tablet Take 40 mg by mouth daily.     glipiZIDE (GLUCOTROL XL) 10 MG 24 hr tablet Take 10 mg by mouth daily.     insulin  glargine, 1 Unit Dial, (TOUJEO) 300 UNIT/ML Solostar Pen Inject 15 Units into the skin.     nitroGLYCERIN (NITROSTAT) 0.4 MG SL tablet Place 0.4 mg under the tongue every 5 (five) minutes as needed for chest pain.     Pitavastatin Calcium (LIVALO) 1 MG TABS Take 1 tablet by mouth daily.     pramipexole  (MIRAPEX ) 1 MG tablet Take 0.5-1 mg by mouth See admin instructions. 0.5 mg in the am. 1mg  hs     spironolactone (ALDACTONE) 25 MG tablet Take 25 mg by mouth daily.     trimethoprim (TRIMPEX) 100 MG tablet Take 100 mg by  mouth daily.     ACCU-CHEK AVIVA PLUS test strip 1 each by Other route Once Daily.     Blood Glucose Monitoring Suppl (TGT BLOOD GLUCOSE MONITORING) w/Device KIT Use as directed to check blood sugars (dx code E11.9)     Lancets (ACCU-CHEK SOFT TOUCH) lancets 1 each in the morning and at bedtime.     niacin (VITAMIN B3) 500 MG tablet Take 500 mg by mouth 2 (two) times daily with a meal.     No current facility-administered medications for this visit.    ALLERGIES:  Allergies[1]  FAMILY HISTORY:   Family History  Problem Relation Age of Onset   Arthritis Mother    Endocrine tumor Mother    Arthritis Father    Colon cancer Father    Hypertension Sister    Thyroid  disease Sister    Heart disease Sister    Breast cancer Sister      SOCIAL HISTORY:  The patient was born and raised in [place],  [place] .  She currently lives in [place].  She is widowed; she was previously married for 53 years.  She has 2 children, 5 grandchildren, and 14 great-grandchildren.  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  There is no history of alcoholism or tobacco abuse.  REVIEW OF SYSTEMS:  Review of Systems  Constitutional:  Positive for fatigue. Negative for fever.  HENT:   Negative for hearing loss and sore throat.   Eyes:  Negative for eye problems.  Respiratory:  Negative for chest tightness, cough and hemoptysis.   Cardiovascular:  Negative for chest pain and palpitations.  Gastrointestinal:  Positive for constipation. Negative for abdominal distention, abdominal pain, blood in stool, diarrhea, nausea and vomiting.  Endocrine: Negative for hot flashes.  Genitourinary:  Negative for difficulty urinating, dysuria, frequency, hematuria and nocturia.   Musculoskeletal:  Positive for arthralgias and gait problem. Negative for back pain and myalgias.  Skin: Negative.  Negative for itching and rash.  Neurological:  Positive for gait problem. Negative for dizziness, extremity weakness, headaches, light-headedness and numbness.  Hematological: Negative.   Psychiatric/Behavioral: Negative.  Negative for depression and suicidal ideas. The patient is not nervous/anxious.     PHYSICAL EXAM:  Blood pressure (!) 157/61, pulse 63, temperature 97.7 F (36.5 C), temperature source Oral, resp. rate 20, weight 177 lb (80.3 kg), SpO2 96%. Wt Readings from Last 3 Encounters:  04/26/24 177 lb (80.3 kg)  09/18/22 185 lb (83.9 kg)  08/19/22 178 lb (80.7 kg)   Body mass index is 33.44 kg/m. Performance status (ECOG): 1 - Symptomatic but completely ambulatory Physical Exam Constitutional:      Appearance: Normal appearance. She is not ill-appearing.  HENT:     Mouth/Throat:     Mouth: Mucous membranes are moist.      Pharynx: Oropharynx is clear. No oropharyngeal exudate or posterior oropharyngeal erythema.  Cardiovascular:     Rate and Rhythm: Normal rate and regular rhythm.     Heart sounds: No murmur heard.    No friction rub. No gallop.  Pulmonary:     Effort: Pulmonary effort is normal. No respiratory distress.     Breath sounds: Normal breath sounds. No wheezing, rhonchi or rales.  Abdominal:     General: Bowel sounds are normal. There is no distension.     Palpations: Abdomen is soft. There is no mass.     Tenderness: There is no abdominal tenderness.  Musculoskeletal:        General:  No swelling.     Right lower leg: No edema.     Left lower leg: No edema.  Lymphadenopathy:     Cervical: No cervical adenopathy.     Upper Body:     Right upper body: No supraclavicular or axillary adenopathy.     Left upper body: No supraclavicular or axillary adenopathy.     Lower Body: No right inguinal adenopathy. No left inguinal adenopathy.  Skin:    General: Skin is warm.     Coloration: Skin is not jaundiced.     Findings: No lesion or rash.  Neurological:     General: No focal deficit present.     Mental Status: She is alert and oriented to person, place, and time. Mental status is at baseline.  Psychiatric:        Mood and Affect: Mood normal.        Behavior: Behavior normal.        Thought Content: Thought content normal.     LABS:      Latest Ref Rng & Units 04/26/2024   10:33 AM 06/10/2020    1:35 AM 06/09/2020    2:41 AM  CBC  WBC 4.0 - 10.5 K/uL 6.0  10.2  6.6   Hemoglobin 12.0 - 15.0 g/dL 83.9  85.1  85.0   Hematocrit 36.0 - 46.0 % 48.6  43.4  44.1   Platelets 150 - 400 K/uL 234  198  179       Latest Ref Rng & Units 04/26/2024   10:33 AM 06/10/2020    1:35 AM 06/09/2020    2:41 AM  CMP  Glucose 70 - 99 mg/dL 97  637  720   BUN 8 - 23 mg/dL 19  27  24    Creatinine 0.44 - 1.00 mg/dL 9.18  9.12  9.12   Sodium 135 - 145 mmol/L 141  134  134   Potassium 3.5 - 5.1 mmol/L 3.4   4.1  3.8   Chloride 98 - 111 mmol/L 103  100  101   CO2 22 - 32 mmol/L 27  22  24    Calcium 8.9 - 10.3 mg/dL 9.8  8.6  8.7   Total Protein 6.5 - 8.1 g/dL 6.9     Total Bilirubin 0.0 - 1.2 mg/dL 1.1     Alkaline Phos 38 - 126 U/L 84     AST 15 - 41 U/L 17     ALT 0 - 44 U/L 7      ASSESSMENT & PLAN:  An 83 y.o. female who I was asked to consult upon for polycythemia.  When evaluating her CBC today, her hemoglobin is lower at 16 today.  I will check her JAK2 mutation status to ensure she does not have underlying polycythemia rubra vera.  Her iron parameters will also be checked today to ensure her polycythemia is not due to iron overload/hemochromatosis.  When evaluating her medication list, this patient is on spironolactone, which is a diuretic.  This could lead to slightly elevated red cell indices via pseudothrombocytopenia.  The patient is also on Farxiga, which could also cause slightly elevated red cell indices.  However, her red cell indices today are not particularly high to where any immediate intervention needs to be considered.  I will see this patient back in 6 weeks to go over all of her labs collected today, as well as to reassess her CBC at that time.  The patient understands all the plans discussed today  and is in agreement with them.  I do appreciate Gerome Brunet, DO for his new consult.   Akili Corsetti DELENA Kerns, MD           [1]  Allergies Allergen Reactions   Morphine Shortness Of Breath    Other reaction(s): Confusion (intolerance), Other (See Comments) disorientedddrives me crazy    Metformin      Abdominal cramping-intense   Morphine And Codeine    Betadine [Povidone Iodine] Rash   Latex Rash   Povidone-Iodine Rash   Tape Rash and Other (See Comments)    adhesive Band aids   "

## 2024-04-26 ENCOUNTER — Other Ambulatory Visit: Payer: Self-pay

## 2024-04-26 ENCOUNTER — Inpatient Hospital Stay

## 2024-04-26 ENCOUNTER — Inpatient Hospital Stay: Attending: Oncology | Admitting: Oncology

## 2024-04-26 ENCOUNTER — Encounter: Payer: Self-pay | Admitting: Oncology

## 2024-04-26 ENCOUNTER — Other Ambulatory Visit: Payer: Self-pay | Admitting: Oncology

## 2024-04-26 VITALS — BP 157/61 | HR 63 | Temp 97.7°F | Resp 20 | Wt 177.0 lb

## 2024-04-26 DIAGNOSIS — R7989 Other specified abnormal findings of blood chemistry: Secondary | ICD-10-CM | POA: Diagnosis not present

## 2024-04-26 DIAGNOSIS — D751 Secondary polycythemia: Secondary | ICD-10-CM | POA: Insufficient documentation

## 2024-04-26 DIAGNOSIS — R718 Other abnormality of red blood cells: Secondary | ICD-10-CM

## 2024-04-26 DIAGNOSIS — E119 Type 2 diabetes mellitus without complications: Secondary | ICD-10-CM | POA: Insufficient documentation

## 2024-04-26 LAB — CBC WITH DIFFERENTIAL (CANCER CENTER ONLY)
Abs Immature Granulocytes: 0.01 K/uL (ref 0.00–0.07)
Basophils Absolute: 0 K/uL (ref 0.0–0.1)
Basophils Relative: 0 %
Eosinophils Absolute: 0.2 K/uL (ref 0.0–0.5)
Eosinophils Relative: 3 %
HCT: 48.6 % — ABNORMAL HIGH (ref 36.0–46.0)
Hemoglobin: 16 g/dL — ABNORMAL HIGH (ref 12.0–15.0)
Immature Granulocytes: 0 %
Lymphocytes Relative: 36 %
Lymphs Abs: 2.2 K/uL (ref 0.7–4.0)
MCH: 27.4 pg (ref 26.0–34.0)
MCHC: 32.9 g/dL (ref 30.0–36.0)
MCV: 83.1 fL (ref 80.0–100.0)
Monocytes Absolute: 0.4 K/uL (ref 0.1–1.0)
Monocytes Relative: 7 %
Neutro Abs: 3.2 K/uL (ref 1.7–7.7)
Neutrophils Relative %: 54 %
Platelet Count: 234 K/uL (ref 150–400)
RBC: 5.85 MIL/uL — ABNORMAL HIGH (ref 3.87–5.11)
RDW: 13.1 % (ref 11.5–15.5)
WBC Count: 6 K/uL (ref 4.0–10.5)
nRBC: 0 % (ref 0.0–0.2)

## 2024-04-26 LAB — CMP (CANCER CENTER ONLY)
ALT: 7 U/L (ref 0–44)
AST: 17 U/L (ref 15–41)
Albumin: 4.4 g/dL (ref 3.5–5.0)
Alkaline Phosphatase: 84 U/L (ref 38–126)
Anion gap: 12 (ref 5–15)
BUN: 19 mg/dL (ref 8–23)
CO2: 27 mmol/L (ref 22–32)
Calcium: 9.8 mg/dL (ref 8.9–10.3)
Chloride: 103 mmol/L (ref 98–111)
Creatinine: 0.81 mg/dL (ref 0.44–1.00)
GFR, Estimated: >60 mL/min (ref >60)
Glucose, Bld: 97 mg/dL (ref 70–99)
Potassium: 3.4 mmol/L — ABNORMAL LOW (ref 3.5–5.1)
Sodium: 141 mmol/L (ref 135–145)
Total Bilirubin: 1.1 mg/dL (ref 0.0–1.2)
Total Protein: 6.9 g/dL (ref 6.5–8.1)

## 2024-04-26 LAB — FERRITIN: Ferritin: 127 ng/mL (ref 11–307)

## 2024-04-26 LAB — IRON AND TIBC
Iron: 64 ug/dL (ref 28–170)
Saturation Ratios: 17 % (ref 10.4–31.8)
TIBC: 367 ug/dL (ref 250–450)
UIBC: 303 ug/dL

## 2024-04-27 ENCOUNTER — Telehealth: Payer: Self-pay | Admitting: Oncology

## 2024-04-27 NOTE — Telephone Encounter (Signed)
 Patient has been scheduled for follow-up visit per 04/26/2024 LOS.  Pt aware of scheduled appt details.

## 2024-05-15 DIAGNOSIS — D751 Secondary polycythemia: Secondary | ICD-10-CM | POA: Insufficient documentation

## 2024-06-06 ENCOUNTER — Other Ambulatory Visit: Payer: Self-pay | Admitting: Oncology

## 2024-06-06 DIAGNOSIS — D751 Secondary polycythemia: Secondary | ICD-10-CM

## 2024-06-06 NOTE — Progress Notes (Unsigned)
 " [facility] at [facility] [address] [place],  [place]  [address] [phone]  Clinic Day:  04/26/2024  Referring physician: Ina Marcellus RAMAN, MD   HISTORY OF PRESENT ILLNESS:  The patient is a 84 y.o. female who I was asked to consult upon for polycythemia.  Recent labs in November 2025 showed an elevated hemoglobin of 17, an elevated hematocrit of 49, and an elevated red blood count of 6.11 million.   According to the patient, her elevated hemoglobin was found incidentally per routine blood work.  She denies ever being told before of having elevated red blood cell indices.  She denies having a history of dehydration, which can cause a pseudopolycythemia.  She also denies having undergone a previous splenectomy, which can lead to chronically elevated red cell indices.  She also denies having any B symptoms which concern her for her polycythemia being due to an underlying hematologic malignancy.  The patient just started taking insulin  and a cholesterol medicine.  However, she does not believe she is on any medications which are known to cause a secondary polycythemia.  To her knowledge, there is no family history of polycythemia or other hematologic disorders.  PAST MEDICAL HISTORY:   Past Medical History:  Diagnosis Date   Arthritis    CAD (coronary artery disease)    stent to LAD in July '21   Chronic total occlusion of coronary artery    Diabetes mellitus without complication (HCC)    Gait disturbance    Heart murmur    Hypertension    Mild basilar atelectasis of both lungs    Non-compliance with treatment    Paroxysmal atrial fibrillation (HCC)    Renal carcinoma (HCC)    RLS (restless legs syndrome)    Statin intolerance    Status post percutaneous coronary intervention (PCI)    Urinary incontinence     PAST SURGICAL HISTORY:   Past Surgical History:  Procedure Laterality Date   ABDOMINAL HYSTERECTOMY     APPENDECTOMY      CHOLECYSTECTOMY     CORONARY ANGIOPLASTY WITH STENT PLACEMENT     NEPHRECTOMY Left     CURRENT MEDICATIONS:   Current Outpatient Medications  Medication Sig Dispense Refill   ACCU-CHEK AVIVA PLUS test strip 1 each by Other route Once Daily.     acetaminophen -codeine (TYLENOL  #3) 300-30 MG tablet Take 1 tablet by mouth every 8 (eight) hours as needed for moderate pain (pain score 4-6).     aspirin  81 MG chewable tablet Chew by mouth.     Blood Glucose Monitoring Suppl (TGT BLOOD GLUCOSE MONITORING) w/Device KIT Use as directed to check blood sugars (dx code E11.9)     dapagliflozin propanediol (FARXIGA) 10 MG TABS tablet Take 10 mg by mouth daily.     ELIQUIS  5 MG TABS tablet Take 5 mg by mouth 2 (two) times daily.     furosemide  (LASIX ) 40 MG tablet Take 40 mg by mouth daily.     glipiZIDE (GLUCOTROL XL) 10 MG 24 hr tablet Take 10 mg by mouth daily.     insulin  glargine, 1 Unit Dial, (TOUJEO) 300 UNIT/ML Solostar Pen Inject 15 Units into the skin.     Lancets (ACCU-CHEK SOFT TOUCH) lancets 1 each in the morning and at bedtime.     niacin (VITAMIN B3) 500 MG tablet Take 500 mg by mouth 2 (two) times daily with a meal.     nitroGLYCERIN (NITROSTAT) 0.4 MG SL tablet Place 0.4 mg  under the tongue every 5 (five) minutes as needed for chest pain.     Pitavastatin Calcium (LIVALO) 1 MG TABS Take 1 tablet by mouth daily.     pramipexole  (MIRAPEX ) 1 MG tablet Take 0.5-1 mg by mouth See admin instructions. 0.5 mg in the am. 1mg  hs     spironolactone (ALDACTONE) 25 MG tablet Take 25 mg by mouth daily.     trimethoprim (TRIMPEX) 100 MG tablet Take 100 mg by mouth daily.     No current facility-administered medications for this visit.    ALLERGIES:  Allergies[1]  FAMILY HISTORY:   Family History  Problem Relation Age of Onset   Arthritis Mother    Endocrine tumor Mother    Arthritis Father    Colon cancer Father    Hypertension Sister    Thyroid  disease Sister     Heart disease Sister    Breast cancer Sister     SOCIAL HISTORY:  The patient was born and raised in [place],  [place] .  She currently lives in [place].  She is widowed; she was previously married for 53 years.  She has 2 children, 5 grandchildren, and 14 great-grandchildren.  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  There is no history of alcoholism or tobacco abuse.  REVIEW OF SYSTEMS:  Review of Systems  Constitutional:  Positive for fatigue. Negative for fever.  HENT:   Negative for hearing loss and sore throat.   Eyes:  Negative for eye problems.  Respiratory:  Negative for chest tightness, cough and hemoptysis.   Cardiovascular:  Negative for chest pain and palpitations.  Gastrointestinal:  Positive for constipation. Negative for abdominal distention, abdominal pain, blood in stool, diarrhea, nausea and vomiting.  Endocrine: Negative for hot flashes.  Genitourinary:  Negative for difficulty urinating, dysuria, frequency, hematuria and nocturia.   Musculoskeletal:  Positive for arthralgias and gait problem. Negative for back pain and myalgias.  Skin: Negative.  Negative for itching and rash.  Neurological:  Positive for gait problem. Negative for dizziness, extremity weakness, headaches, light-headedness and numbness.  Hematological: Negative.   Psychiatric/Behavioral: Negative.  Negative for depression and suicidal ideas. The patient is not nervous/anxious.     PHYSICAL EXAM:  There were no vitals taken for this visit. Wt Readings from Last 3 Encounters:  04/26/24 177 lb (80.3 kg)  09/18/22 185 lb (83.9 kg)  08/19/22 178 lb (80.7 kg)   There is no height or weight on file to calculate BMI. Performance status (ECOG): 1 - Symptomatic but completely ambulatory Physical Exam Constitutional:      Appearance: Normal appearance. She is not ill-appearing.  HENT:     Mouth/Throat:     Mouth: Mucous membranes are moist.     Pharynx: Oropharynx is clear. No  oropharyngeal exudate or posterior oropharyngeal erythema.  Cardiovascular:     Rate and Rhythm: Normal rate and regular rhythm.     Heart sounds: No murmur heard.    No friction rub. No gallop.  Pulmonary:     Effort: Pulmonary effort is normal. No respiratory distress.     Breath sounds: Normal breath sounds. No wheezing, rhonchi or rales.  Abdominal:     General: Bowel sounds are normal. There is no distension.     Palpations: Abdomen is soft. There is no mass.     Tenderness: There is no abdominal tenderness.  Musculoskeletal:        General: No swelling.     Right lower leg: No  edema.     Left lower leg: No edema.  Lymphadenopathy:     Cervical: No cervical adenopathy.     Upper Body:     Right upper body: No supraclavicular or axillary adenopathy.     Left upper body: No supraclavicular or axillary adenopathy.     Lower Body: No right inguinal adenopathy. No left inguinal adenopathy.  Skin:    General: Skin is warm.     Coloration: Skin is not jaundiced.     Findings: No lesion or rash.  Neurological:     General: No focal deficit present.     Mental Status: She is alert and oriented to person, place, and time. Mental status is at baseline.  Psychiatric:        Mood and Affect: Mood normal.        Behavior: Behavior normal.        Thought Content: Thought content normal.     LABS:      Latest Ref Rng & Units 04/26/2024   10:33 AM 06/10/2020    1:35 AM 06/09/2020    2:41 AM  CBC  WBC 4.0 - 10.5 K/uL 6.0  10.2  6.6   Hemoglobin 12.0 - 15.0 g/dL 83.9  85.1  85.0   Hematocrit 36.0 - 46.0 % 48.6  43.4  44.1   Platelets 150 - 400 K/uL 234  198  179       Latest Ref Rng & Units 04/26/2024   10:33 AM 06/10/2020    1:35 AM 06/09/2020    2:41 AM  CMP  Glucose 70 - 99 mg/dL 97  637  720   BUN 8 - 23 mg/dL 19  27  24    Creatinine 0.44 - 1.00 mg/dL 9.18  9.12  9.12   Sodium 135 - 145 mmol/L 141  134  134   Potassium 3.5 - 5.1 mmol/L 3.4  4.1  3.8   Chloride 98 - 111  mmol/L 103  100  101   CO2 22 - 32 mmol/L 27  22  24    Calcium 8.9 - 10.3 mg/dL 9.8  8.6  8.7   Total Protein 6.5 - 8.1 g/dL 6.9     Total Bilirubin 0.0 - 1.2 mg/dL 1.1     Alkaline Phos 38 - 126 U/L 84     AST 15 - 41 U/L 17     ALT 0 - 44 U/L 7       Latest Reference Range & Units 04/26/24 10:33  Iron 28 - 170 ug/dL 64  UIBC ug/dL 696  TIBC 749 - 549 ug/dL 632  Saturation Ratios 10.4 - 31.8 % 17  Ferritin 11 - 307 ng/mL 127   ASSESSMENT & PLAN:  An 84 y.o. female who I was asked to consult upon for polycythemia.  When evaluating her CBC today, her hemoglobin is lower at 16 today.  I will check her JAK2 mutation status to ensure she does not have underlying polycythemia rubra vera.  Her iron parameters will also be checked today to ensure her polycythemia is not due to iron overload/hemochromatosis.  When evaluating her medication list, this patient is on spironolactone, which is a diuretic.  This could lead to slightly elevated red cell indices via pseudothrombocytopenia.  The patient is also on Farxiga, which could also cause slightly elevated red cell indices.  However, her red cell indices today are not particularly high to where any immediate intervention needs to be considered.  I will see this  patient back in 6 weeks to go over all of her labs collected today, as well as to reassess her CBC at that time.  The patient understands all the plans discussed today and is in agreement with them.  I do appreciate Ina Marcellus RAMAN, MD for his new consult.   Heide Brossart DELENA Kerns, MD              [1] Allergies Allergen Reactions   Morphine Shortness Of Breath    Other reaction(s): Confusion (intolerance), Other (See Comments) disorientedddrives me crazy    Metformin      Abdominal cramping-intense   Morphine And Codeine    Betadine [Povidone Iodine] Rash   Latex Rash   Povidone-Iodine Rash   Tape Rash and Other (See Comments)    adhesive Band aids  "

## 2024-06-07 ENCOUNTER — Inpatient Hospital Stay

## 2024-06-07 ENCOUNTER — Other Ambulatory Visit: Payer: Self-pay | Admitting: Oncology

## 2024-06-07 ENCOUNTER — Inpatient Hospital Stay: Attending: Oncology | Admitting: Oncology

## 2024-06-07 DIAGNOSIS — D751 Secondary polycythemia: Secondary | ICD-10-CM | POA: Insufficient documentation

## 2024-06-07 LAB — CBC WITH DIFFERENTIAL (CANCER CENTER ONLY)
Abs Immature Granulocytes: 0.01 10*3/uL (ref 0.00–0.07)
Basophils Absolute: 0 10*3/uL (ref 0.0–0.1)
Basophils Relative: 1 %
Eosinophils Absolute: 0.2 10*3/uL (ref 0.0–0.5)
Eosinophils Relative: 4 %
HCT: 49.4 % — ABNORMAL HIGH (ref 36.0–46.0)
Hemoglobin: 16.3 g/dL — ABNORMAL HIGH (ref 12.0–15.0)
Immature Granulocytes: 0 %
Lymphocytes Relative: 33 %
Lymphs Abs: 2 10*3/uL (ref 0.7–4.0)
MCH: 27.3 pg (ref 26.0–34.0)
MCHC: 33 g/dL (ref 30.0–36.0)
MCV: 82.7 fL (ref 80.0–100.0)
Monocytes Absolute: 0.4 10*3/uL (ref 0.1–1.0)
Monocytes Relative: 7 %
Neutro Abs: 3.4 10*3/uL (ref 1.7–7.7)
Neutrophils Relative %: 55 %
Platelet Count: 233 10*3/uL (ref 150–400)
RBC: 5.97 MIL/uL — ABNORMAL HIGH (ref 3.87–5.11)
RDW: 13.1 % (ref 11.5–15.5)
WBC Count: 6 10*3/uL (ref 4.0–10.5)
nRBC: 0 % (ref 0.0–0.2)

## 2024-06-09 ENCOUNTER — Telehealth: Payer: Self-pay

## 2024-06-09 NOTE — Telephone Encounter (Signed)
 JAK 2 results still pending @ this time.

## 2024-06-16 LAB — JAK2 V617F RFX CALR/MPL/E12-15

## 2024-06-16 LAB — CALR +MPL + E12-E15  (REFLEX)

## 2024-06-17 NOTE — Telephone Encounter (Signed)
 Dr Ezzard:  let her know the results are all NEGATIVE for a bone marrow disorder being present.

## 2024-10-04 ENCOUNTER — Inpatient Hospital Stay: Admitting: Oncology

## 2024-10-04 ENCOUNTER — Inpatient Hospital Stay
